# Patient Record
Sex: Male | Born: 1945 | Race: White | Hispanic: No | Smoking: Former smoker
Health system: Southern US, Community
[De-identification: ages and names within clinical notes are randomized; demographics above are authoritative.]

## PROBLEM LIST (undated history)

## (undated) DIAGNOSIS — K219 Gastro-esophageal reflux disease without esophagitis: Secondary | ICD-10-CM

## (undated) DIAGNOSIS — Z6834 Body mass index (BMI) 34.0-34.9, adult: Secondary | ICD-10-CM

## (undated) DIAGNOSIS — M1990 Unspecified osteoarthritis, unspecified site: Secondary | ICD-10-CM

## (undated) DIAGNOSIS — R7989 Other specified abnormal findings of blood chemistry: Secondary | ICD-10-CM

## (undated) DIAGNOSIS — N529 Male erectile dysfunction, unspecified: Secondary | ICD-10-CM

## (undated) DIAGNOSIS — N183 Chronic kidney disease, stage 3 (moderate): Secondary | ICD-10-CM

## (undated) DIAGNOSIS — G629 Polyneuropathy, unspecified: Secondary | ICD-10-CM

## (undated) DIAGNOSIS — I441 Atrioventricular block, second degree: Secondary | ICD-10-CM

## (undated) DIAGNOSIS — M549 Dorsalgia, unspecified: Secondary | ICD-10-CM

## (undated) DIAGNOSIS — E7800 Pure hypercholesterolemia, unspecified: Secondary | ICD-10-CM

## (undated) DIAGNOSIS — K5900 Constipation, unspecified: Secondary | ICD-10-CM

## (undated) DIAGNOSIS — Z87891 Personal history of nicotine dependence: Secondary | ICD-10-CM

## (undated) DIAGNOSIS — E119 Type 2 diabetes mellitus without complications: Secondary | ICD-10-CM

## (undated) DIAGNOSIS — E785 Hyperlipidemia, unspecified: Secondary | ICD-10-CM

## (undated) DIAGNOSIS — I10 Essential (primary) hypertension: Secondary | ICD-10-CM

## (undated) DIAGNOSIS — N289 Disorder of kidney and ureter, unspecified: Secondary | ICD-10-CM

## (undated) DIAGNOSIS — R945 Abnormal results of liver function studies: Secondary | ICD-10-CM

## (undated) DIAGNOSIS — E1122 Type 2 diabetes mellitus with diabetic chronic kidney disease: Secondary | ICD-10-CM

## (undated) HISTORY — DX: Hyperlipidemia, unspecified: E78.5

## (undated) HISTORY — DX: Abnormal results of liver function studies: R94.5

## (undated) HISTORY — DX: Polyneuropathy, unspecified: G62.9

## (undated) HISTORY — DX: Dorsalgia, unspecified: M54.9

## (undated) HISTORY — DX: Chronic kidney disease, stage 3 (moderate): N18.3

## (undated) HISTORY — DX: Pure hypercholesterolemia, unspecified: E78.00

## (undated) HISTORY — DX: Type 2 diabetes mellitus without complications: E11.9

## (undated) HISTORY — DX: Male erectile dysfunction, unspecified: N52.9

## (undated) HISTORY — DX: Unspecified osteoarthritis, unspecified site: M19.90

## (undated) HISTORY — DX: Body mass index (bmi) 34.0-34.9, adult: Z68.34

## (undated) HISTORY — DX: Type 2 diabetes mellitus with diabetic chronic kidney disease: E11.22

## (undated) HISTORY — DX: Personal history of nicotine dependence: Z87.891

## (undated) HISTORY — PX: CATARACT EXTRACTION, BILATERAL: SHX1313

## (undated) HISTORY — DX: Atrioventricular block, second degree: I44.1

## (undated) HISTORY — DX: Disorder of kidney and ureter, unspecified: N28.9

## (undated) HISTORY — DX: Essential (primary) hypertension: I10

## (undated) HISTORY — DX: Other specified abnormal findings of blood chemistry: R79.89

## (undated) HISTORY — DX: Gastro-esophageal reflux disease without esophagitis: K21.9

## (undated) HISTORY — DX: Constipation, unspecified: K59.00

---

## 2000-01-16 ENCOUNTER — Encounter: Payer: Self-pay | Admitting: Neurosurgery

## 2000-01-20 ENCOUNTER — Encounter: Payer: Self-pay | Admitting: Neurosurgery

## 2000-01-20 ENCOUNTER — Inpatient Hospital Stay (HOSPITAL_COMMUNITY): Admission: RE | Admit: 2000-01-20 | Discharge: 2000-01-24 | Payer: Self-pay | Admitting: Neurosurgery

## 2000-02-05 ENCOUNTER — Encounter: Payer: Self-pay | Admitting: Neurosurgery

## 2000-02-05 ENCOUNTER — Encounter: Admission: RE | Admit: 2000-02-05 | Discharge: 2000-02-05 | Payer: Self-pay | Admitting: Neurosurgery

## 2000-03-29 ENCOUNTER — Encounter: Payer: Self-pay | Admitting: Neurosurgery

## 2000-03-29 ENCOUNTER — Encounter: Admission: RE | Admit: 2000-03-29 | Discharge: 2000-03-29 | Payer: Self-pay | Admitting: Neurosurgery

## 2000-06-02 ENCOUNTER — Encounter: Payer: Self-pay | Admitting: Neurosurgery

## 2000-06-02 ENCOUNTER — Encounter: Admission: RE | Admit: 2000-06-02 | Discharge: 2000-06-02 | Payer: Self-pay | Admitting: Neurosurgery

## 2000-12-17 ENCOUNTER — Encounter: Payer: Self-pay | Admitting: Neurosurgery

## 2000-12-17 ENCOUNTER — Ambulatory Visit (HOSPITAL_COMMUNITY): Admission: RE | Admit: 2000-12-17 | Discharge: 2000-12-17 | Payer: Self-pay | Admitting: Neurosurgery

## 2001-02-04 ENCOUNTER — Encounter: Payer: Self-pay | Admitting: Neurosurgery

## 2001-02-08 ENCOUNTER — Encounter: Payer: Self-pay | Admitting: Neurosurgery

## 2001-02-08 ENCOUNTER — Inpatient Hospital Stay (HOSPITAL_COMMUNITY): Admission: RE | Admit: 2001-02-08 | Discharge: 2001-02-09 | Payer: Self-pay | Admitting: Neurosurgery

## 2001-03-24 ENCOUNTER — Encounter: Admission: RE | Admit: 2001-03-24 | Discharge: 2001-03-24 | Payer: Self-pay | Admitting: Neurosurgery

## 2001-03-24 ENCOUNTER — Encounter: Payer: Self-pay | Admitting: Neurosurgery

## 2015-08-23 DIAGNOSIS — E119 Type 2 diabetes mellitus without complications: Secondary | ICD-10-CM | POA: Diagnosis not present

## 2015-09-17 DIAGNOSIS — L03115 Cellulitis of right lower limb: Secondary | ICD-10-CM | POA: Diagnosis not present

## 2015-09-17 DIAGNOSIS — M7050 Other bursitis of knee, unspecified knee: Secondary | ICD-10-CM | POA: Diagnosis not present

## 2015-09-17 DIAGNOSIS — E7800 Pure hypercholesterolemia, unspecified: Secondary | ICD-10-CM | POA: Diagnosis not present

## 2015-09-17 DIAGNOSIS — M7989 Other specified soft tissue disorders: Secondary | ICD-10-CM | POA: Diagnosis not present

## 2015-09-17 DIAGNOSIS — M25561 Pain in right knee: Secondary | ICD-10-CM | POA: Diagnosis not present

## 2015-09-17 DIAGNOSIS — Z8614 Personal history of Methicillin resistant Staphylococcus aureus infection: Secondary | ICD-10-CM | POA: Diagnosis not present

## 2015-09-17 DIAGNOSIS — M25461 Effusion, right knee: Secondary | ICD-10-CM | POA: Diagnosis not present

## 2015-09-17 DIAGNOSIS — N400 Enlarged prostate without lower urinary tract symptoms: Secondary | ICD-10-CM | POA: Diagnosis not present

## 2015-09-17 DIAGNOSIS — Z7982 Long term (current) use of aspirin: Secondary | ICD-10-CM | POA: Diagnosis not present

## 2015-09-17 DIAGNOSIS — I10 Essential (primary) hypertension: Secondary | ICD-10-CM | POA: Diagnosis not present

## 2015-09-17 DIAGNOSIS — M71161 Other infective bursitis, right knee: Secondary | ICD-10-CM | POA: Diagnosis not present

## 2015-09-18 DIAGNOSIS — E7800 Pure hypercholesterolemia, unspecified: Secondary | ICD-10-CM | POA: Diagnosis not present

## 2015-09-18 DIAGNOSIS — Z8614 Personal history of Methicillin resistant Staphylococcus aureus infection: Secondary | ICD-10-CM | POA: Diagnosis not present

## 2015-09-18 DIAGNOSIS — I10 Essential (primary) hypertension: Secondary | ICD-10-CM | POA: Diagnosis not present

## 2015-09-18 DIAGNOSIS — N400 Enlarged prostate without lower urinary tract symptoms: Secondary | ICD-10-CM | POA: Diagnosis not present

## 2015-09-18 DIAGNOSIS — M71161 Other infective bursitis, right knee: Secondary | ICD-10-CM | POA: Diagnosis not present

## 2015-09-18 DIAGNOSIS — Z7982 Long term (current) use of aspirin: Secondary | ICD-10-CM | POA: Diagnosis not present

## 2015-09-18 DIAGNOSIS — L03115 Cellulitis of right lower limb: Secondary | ICD-10-CM | POA: Diagnosis not present

## 2015-09-19 DIAGNOSIS — M25561 Pain in right knee: Secondary | ICD-10-CM | POA: Diagnosis not present

## 2015-09-19 DIAGNOSIS — Z8614 Personal history of Methicillin resistant Staphylococcus aureus infection: Secondary | ICD-10-CM | POA: Diagnosis not present

## 2015-09-19 DIAGNOSIS — M71161 Other infective bursitis, right knee: Secondary | ICD-10-CM | POA: Diagnosis not present

## 2015-09-19 DIAGNOSIS — E7800 Pure hypercholesterolemia, unspecified: Secondary | ICD-10-CM | POA: Diagnosis not present

## 2015-09-19 DIAGNOSIS — N400 Enlarged prostate without lower urinary tract symptoms: Secondary | ICD-10-CM | POA: Diagnosis not present

## 2015-09-19 DIAGNOSIS — Z7982 Long term (current) use of aspirin: Secondary | ICD-10-CM | POA: Diagnosis not present

## 2015-09-19 DIAGNOSIS — I10 Essential (primary) hypertension: Secondary | ICD-10-CM | POA: Diagnosis not present

## 2015-09-19 DIAGNOSIS — L03115 Cellulitis of right lower limb: Secondary | ICD-10-CM | POA: Diagnosis not present

## 2015-09-20 DIAGNOSIS — L03115 Cellulitis of right lower limb: Secondary | ICD-10-CM | POA: Diagnosis not present

## 2015-09-20 DIAGNOSIS — Z8614 Personal history of Methicillin resistant Staphylococcus aureus infection: Secondary | ICD-10-CM | POA: Diagnosis not present

## 2015-09-20 DIAGNOSIS — I10 Essential (primary) hypertension: Secondary | ICD-10-CM | POA: Diagnosis not present

## 2015-09-20 DIAGNOSIS — Z7982 Long term (current) use of aspirin: Secondary | ICD-10-CM | POA: Diagnosis not present

## 2015-09-20 DIAGNOSIS — N400 Enlarged prostate without lower urinary tract symptoms: Secondary | ICD-10-CM | POA: Diagnosis not present

## 2015-09-20 DIAGNOSIS — M71161 Other infective bursitis, right knee: Secondary | ICD-10-CM | POA: Diagnosis not present

## 2015-09-20 DIAGNOSIS — E7800 Pure hypercholesterolemia, unspecified: Secondary | ICD-10-CM | POA: Diagnosis not present

## 2015-09-21 DIAGNOSIS — M7051 Other bursitis of knee, right knee: Secondary | ICD-10-CM | POA: Diagnosis not present

## 2015-09-21 DIAGNOSIS — K219 Gastro-esophageal reflux disease without esophagitis: Secondary | ICD-10-CM | POA: Diagnosis not present

## 2015-09-21 DIAGNOSIS — N529 Male erectile dysfunction, unspecified: Secondary | ICD-10-CM | POA: Diagnosis not present

## 2015-09-21 DIAGNOSIS — E669 Obesity, unspecified: Secondary | ICD-10-CM | POA: Diagnosis not present

## 2015-09-21 DIAGNOSIS — E7800 Pure hypercholesterolemia, unspecified: Secondary | ICD-10-CM | POA: Diagnosis not present

## 2015-09-21 DIAGNOSIS — I10 Essential (primary) hypertension: Secondary | ICD-10-CM | POA: Diagnosis not present

## 2015-09-21 DIAGNOSIS — M159 Polyosteoarthritis, unspecified: Secondary | ICD-10-CM | POA: Diagnosis not present

## 2015-09-21 DIAGNOSIS — E119 Type 2 diabetes mellitus without complications: Secondary | ICD-10-CM | POA: Diagnosis not present

## 2015-09-21 DIAGNOSIS — M549 Dorsalgia, unspecified: Secondary | ICD-10-CM | POA: Diagnosis not present

## 2015-09-21 DIAGNOSIS — R945 Abnormal results of liver function studies: Secondary | ICD-10-CM | POA: Diagnosis not present

## 2015-10-04 DIAGNOSIS — M7041 Prepatellar bursitis, right knee: Secondary | ICD-10-CM | POA: Diagnosis not present

## 2015-10-19 DIAGNOSIS — K219 Gastro-esophageal reflux disease without esophagitis: Secondary | ICD-10-CM | POA: Diagnosis not present

## 2015-10-19 DIAGNOSIS — E119 Type 2 diabetes mellitus without complications: Secondary | ICD-10-CM | POA: Diagnosis not present

## 2015-10-19 DIAGNOSIS — R945 Abnormal results of liver function studies: Secondary | ICD-10-CM | POA: Diagnosis not present

## 2015-10-19 DIAGNOSIS — N529 Male erectile dysfunction, unspecified: Secondary | ICD-10-CM | POA: Diagnosis not present

## 2015-10-19 DIAGNOSIS — E1122 Type 2 diabetes mellitus with diabetic chronic kidney disease: Secondary | ICD-10-CM | POA: Diagnosis not present

## 2015-10-19 DIAGNOSIS — Z23 Encounter for immunization: Secondary | ICD-10-CM | POA: Diagnosis not present

## 2015-10-19 DIAGNOSIS — I10 Essential (primary) hypertension: Secondary | ICD-10-CM | POA: Diagnosis not present

## 2015-10-19 DIAGNOSIS — Z1389 Encounter for screening for other disorder: Secondary | ICD-10-CM | POA: Diagnosis not present

## 2015-10-19 DIAGNOSIS — K5909 Other constipation: Secondary | ICD-10-CM | POA: Diagnosis not present

## 2015-10-19 DIAGNOSIS — M159 Polyosteoarthritis, unspecified: Secondary | ICD-10-CM | POA: Diagnosis not present

## 2015-11-30 DIAGNOSIS — N529 Male erectile dysfunction, unspecified: Secondary | ICD-10-CM | POA: Diagnosis not present

## 2015-11-30 DIAGNOSIS — E1122 Type 2 diabetes mellitus with diabetic chronic kidney disease: Secondary | ICD-10-CM | POA: Diagnosis not present

## 2015-11-30 DIAGNOSIS — E7800 Pure hypercholesterolemia, unspecified: Secondary | ICD-10-CM | POA: Diagnosis not present

## 2015-11-30 DIAGNOSIS — I10 Essential (primary) hypertension: Secondary | ICD-10-CM | POA: Diagnosis not present

## 2015-11-30 DIAGNOSIS — K5909 Other constipation: Secondary | ICD-10-CM | POA: Diagnosis not present

## 2015-11-30 DIAGNOSIS — K219 Gastro-esophageal reflux disease without esophagitis: Secondary | ICD-10-CM | POA: Diagnosis not present

## 2015-11-30 DIAGNOSIS — M549 Dorsalgia, unspecified: Secondary | ICD-10-CM | POA: Diagnosis not present

## 2015-11-30 DIAGNOSIS — E119 Type 2 diabetes mellitus without complications: Secondary | ICD-10-CM | POA: Diagnosis not present

## 2015-11-30 DIAGNOSIS — M159 Polyosteoarthritis, unspecified: Secondary | ICD-10-CM | POA: Diagnosis not present

## 2015-11-30 DIAGNOSIS — R945 Abnormal results of liver function studies: Secondary | ICD-10-CM | POA: Diagnosis not present

## 2015-12-13 DIAGNOSIS — E7800 Pure hypercholesterolemia, unspecified: Secondary | ICD-10-CM | POA: Diagnosis not present

## 2016-01-04 DIAGNOSIS — I10 Essential (primary) hypertension: Secondary | ICD-10-CM | POA: Diagnosis not present

## 2016-01-04 DIAGNOSIS — R945 Abnormal results of liver function studies: Secondary | ICD-10-CM | POA: Diagnosis not present

## 2016-01-04 DIAGNOSIS — M159 Polyosteoarthritis, unspecified: Secondary | ICD-10-CM | POA: Diagnosis not present

## 2016-01-04 DIAGNOSIS — N529 Male erectile dysfunction, unspecified: Secondary | ICD-10-CM | POA: Diagnosis not present

## 2016-01-04 DIAGNOSIS — E1122 Type 2 diabetes mellitus with diabetic chronic kidney disease: Secondary | ICD-10-CM | POA: Diagnosis not present

## 2016-01-04 DIAGNOSIS — E669 Obesity, unspecified: Secondary | ICD-10-CM | POA: Diagnosis not present

## 2016-01-04 DIAGNOSIS — K219 Gastro-esophageal reflux disease without esophagitis: Secondary | ICD-10-CM | POA: Diagnosis not present

## 2016-01-04 DIAGNOSIS — M549 Dorsalgia, unspecified: Secondary | ICD-10-CM | POA: Diagnosis not present

## 2016-01-04 DIAGNOSIS — E7800 Pure hypercholesterolemia, unspecified: Secondary | ICD-10-CM | POA: Diagnosis not present

## 2016-01-04 DIAGNOSIS — E119 Type 2 diabetes mellitus without complications: Secondary | ICD-10-CM | POA: Diagnosis not present

## 2016-04-04 DIAGNOSIS — Z9181 History of falling: Secondary | ICD-10-CM | POA: Diagnosis not present

## 2016-04-04 DIAGNOSIS — M549 Dorsalgia, unspecified: Secondary | ICD-10-CM | POA: Diagnosis not present

## 2016-04-04 DIAGNOSIS — E119 Type 2 diabetes mellitus without complications: Secondary | ICD-10-CM | POA: Diagnosis not present

## 2016-04-04 DIAGNOSIS — K219 Gastro-esophageal reflux disease without esophagitis: Secondary | ICD-10-CM | POA: Diagnosis not present

## 2016-04-04 DIAGNOSIS — N529 Male erectile dysfunction, unspecified: Secondary | ICD-10-CM | POA: Diagnosis not present

## 2016-04-04 DIAGNOSIS — R945 Abnormal results of liver function studies: Secondary | ICD-10-CM | POA: Diagnosis not present

## 2016-04-04 DIAGNOSIS — E1122 Type 2 diabetes mellitus with diabetic chronic kidney disease: Secondary | ICD-10-CM | POA: Diagnosis not present

## 2016-04-04 DIAGNOSIS — E7800 Pure hypercholesterolemia, unspecified: Secondary | ICD-10-CM | POA: Diagnosis not present

## 2016-04-04 DIAGNOSIS — I10 Essential (primary) hypertension: Secondary | ICD-10-CM | POA: Diagnosis not present

## 2016-04-04 DIAGNOSIS — M159 Polyosteoarthritis, unspecified: Secondary | ICD-10-CM | POA: Diagnosis not present

## 2016-04-13 DIAGNOSIS — M545 Low back pain: Secondary | ICD-10-CM | POA: Diagnosis not present

## 2016-04-13 DIAGNOSIS — S299XXA Unspecified injury of thorax, initial encounter: Secondary | ICD-10-CM | POA: Diagnosis not present

## 2016-04-15 DIAGNOSIS — M545 Low back pain: Secondary | ICD-10-CM | POA: Diagnosis not present

## 2016-07-04 DIAGNOSIS — E119 Type 2 diabetes mellitus without complications: Secondary | ICD-10-CM | POA: Diagnosis not present

## 2016-07-04 DIAGNOSIS — E669 Obesity, unspecified: Secondary | ICD-10-CM | POA: Diagnosis not present

## 2016-07-04 DIAGNOSIS — N529 Male erectile dysfunction, unspecified: Secondary | ICD-10-CM | POA: Diagnosis not present

## 2016-07-04 DIAGNOSIS — M549 Dorsalgia, unspecified: Secondary | ICD-10-CM | POA: Diagnosis not present

## 2016-07-04 DIAGNOSIS — E7800 Pure hypercholesterolemia, unspecified: Secondary | ICD-10-CM | POA: Diagnosis not present

## 2016-07-04 DIAGNOSIS — Z6832 Body mass index (BMI) 32.0-32.9, adult: Secondary | ICD-10-CM | POA: Diagnosis not present

## 2016-07-04 DIAGNOSIS — E1122 Type 2 diabetes mellitus with diabetic chronic kidney disease: Secondary | ICD-10-CM | POA: Diagnosis not present

## 2016-07-04 DIAGNOSIS — R945 Abnormal results of liver function studies: Secondary | ICD-10-CM | POA: Diagnosis not present

## 2016-07-04 DIAGNOSIS — I10 Essential (primary) hypertension: Secondary | ICD-10-CM | POA: Diagnosis not present

## 2016-07-04 DIAGNOSIS — M159 Polyosteoarthritis, unspecified: Secondary | ICD-10-CM | POA: Diagnosis not present

## 2016-10-03 DIAGNOSIS — K219 Gastro-esophageal reflux disease without esophagitis: Secondary | ICD-10-CM | POA: Diagnosis not present

## 2016-10-03 DIAGNOSIS — I10 Essential (primary) hypertension: Secondary | ICD-10-CM | POA: Diagnosis not present

## 2016-10-03 DIAGNOSIS — K5909 Other constipation: Secondary | ICD-10-CM | POA: Diagnosis not present

## 2016-10-03 DIAGNOSIS — M549 Dorsalgia, unspecified: Secondary | ICD-10-CM | POA: Diagnosis not present

## 2016-10-03 DIAGNOSIS — E119 Type 2 diabetes mellitus without complications: Secondary | ICD-10-CM | POA: Diagnosis not present

## 2016-10-03 DIAGNOSIS — E1122 Type 2 diabetes mellitus with diabetic chronic kidney disease: Secondary | ICD-10-CM | POA: Diagnosis not present

## 2016-10-03 DIAGNOSIS — M159 Polyosteoarthritis, unspecified: Secondary | ICD-10-CM | POA: Diagnosis not present

## 2016-10-03 DIAGNOSIS — E7800 Pure hypercholesterolemia, unspecified: Secondary | ICD-10-CM | POA: Diagnosis not present

## 2016-10-03 DIAGNOSIS — R945 Abnormal results of liver function studies: Secondary | ICD-10-CM | POA: Diagnosis not present

## 2016-10-03 DIAGNOSIS — Z23 Encounter for immunization: Secondary | ICD-10-CM | POA: Diagnosis not present

## 2016-10-31 DIAGNOSIS — M549 Dorsalgia, unspecified: Secondary | ICD-10-CM | POA: Diagnosis not present

## 2016-10-31 DIAGNOSIS — K219 Gastro-esophageal reflux disease without esophagitis: Secondary | ICD-10-CM | POA: Diagnosis not present

## 2016-10-31 DIAGNOSIS — K5909 Other constipation: Secondary | ICD-10-CM | POA: Diagnosis not present

## 2016-10-31 DIAGNOSIS — Z1389 Encounter for screening for other disorder: Secondary | ICD-10-CM | POA: Diagnosis not present

## 2016-10-31 DIAGNOSIS — E1122 Type 2 diabetes mellitus with diabetic chronic kidney disease: Secondary | ICD-10-CM | POA: Diagnosis not present

## 2016-10-31 DIAGNOSIS — R945 Abnormal results of liver function studies: Secondary | ICD-10-CM | POA: Diagnosis not present

## 2016-10-31 DIAGNOSIS — I10 Essential (primary) hypertension: Secondary | ICD-10-CM | POA: Diagnosis not present

## 2016-10-31 DIAGNOSIS — E7800 Pure hypercholesterolemia, unspecified: Secondary | ICD-10-CM | POA: Diagnosis not present

## 2016-10-31 DIAGNOSIS — N529 Male erectile dysfunction, unspecified: Secondary | ICD-10-CM | POA: Diagnosis not present

## 2016-10-31 DIAGNOSIS — E119 Type 2 diabetes mellitus without complications: Secondary | ICD-10-CM | POA: Diagnosis not present

## 2016-12-12 DIAGNOSIS — E119 Type 2 diabetes mellitus without complications: Secondary | ICD-10-CM | POA: Diagnosis not present

## 2016-12-12 DIAGNOSIS — R945 Abnormal results of liver function studies: Secondary | ICD-10-CM | POA: Diagnosis not present

## 2016-12-12 DIAGNOSIS — E7800 Pure hypercholesterolemia, unspecified: Secondary | ICD-10-CM | POA: Diagnosis not present

## 2016-12-12 DIAGNOSIS — K219 Gastro-esophageal reflux disease without esophagitis: Secondary | ICD-10-CM | POA: Diagnosis not present

## 2016-12-12 DIAGNOSIS — I10 Essential (primary) hypertension: Secondary | ICD-10-CM | POA: Diagnosis not present

## 2016-12-12 DIAGNOSIS — M549 Dorsalgia, unspecified: Secondary | ICD-10-CM | POA: Diagnosis not present

## 2016-12-12 DIAGNOSIS — K5909 Other constipation: Secondary | ICD-10-CM | POA: Diagnosis not present

## 2016-12-12 DIAGNOSIS — N529 Male erectile dysfunction, unspecified: Secondary | ICD-10-CM | POA: Diagnosis not present

## 2016-12-12 DIAGNOSIS — E1122 Type 2 diabetes mellitus with diabetic chronic kidney disease: Secondary | ICD-10-CM | POA: Diagnosis not present

## 2016-12-12 DIAGNOSIS — M159 Polyosteoarthritis, unspecified: Secondary | ICD-10-CM | POA: Diagnosis not present

## 2017-01-09 DIAGNOSIS — I10 Essential (primary) hypertension: Secondary | ICD-10-CM | POA: Diagnosis not present

## 2017-01-09 DIAGNOSIS — M549 Dorsalgia, unspecified: Secondary | ICD-10-CM | POA: Diagnosis not present

## 2017-01-09 DIAGNOSIS — E7800 Pure hypercholesterolemia, unspecified: Secondary | ICD-10-CM | POA: Diagnosis not present

## 2017-01-09 DIAGNOSIS — E119 Type 2 diabetes mellitus without complications: Secondary | ICD-10-CM | POA: Diagnosis not present

## 2017-01-09 DIAGNOSIS — K5909 Other constipation: Secondary | ICD-10-CM | POA: Diagnosis not present

## 2017-01-09 DIAGNOSIS — G609 Hereditary and idiopathic neuropathy, unspecified: Secondary | ICD-10-CM | POA: Diagnosis not present

## 2017-01-09 DIAGNOSIS — M159 Polyosteoarthritis, unspecified: Secondary | ICD-10-CM | POA: Diagnosis not present

## 2017-01-09 DIAGNOSIS — K219 Gastro-esophageal reflux disease without esophagitis: Secondary | ICD-10-CM | POA: Diagnosis not present

## 2017-01-09 DIAGNOSIS — N529 Male erectile dysfunction, unspecified: Secondary | ICD-10-CM | POA: Diagnosis not present

## 2017-01-09 DIAGNOSIS — R945 Abnormal results of liver function studies: Secondary | ICD-10-CM | POA: Diagnosis not present

## 2017-01-11 DIAGNOSIS — I10 Essential (primary) hypertension: Secondary | ICD-10-CM | POA: Diagnosis not present

## 2017-01-11 DIAGNOSIS — N529 Male erectile dysfunction, unspecified: Secondary | ICD-10-CM | POA: Diagnosis not present

## 2017-01-11 DIAGNOSIS — E119 Type 2 diabetes mellitus without complications: Secondary | ICD-10-CM | POA: Diagnosis not present

## 2017-01-11 DIAGNOSIS — E1122 Type 2 diabetes mellitus with diabetic chronic kidney disease: Secondary | ICD-10-CM | POA: Diagnosis not present

## 2017-01-11 DIAGNOSIS — K219 Gastro-esophageal reflux disease without esophagitis: Secondary | ICD-10-CM | POA: Diagnosis not present

## 2017-01-11 DIAGNOSIS — M159 Polyosteoarthritis, unspecified: Secondary | ICD-10-CM | POA: Diagnosis not present

## 2017-01-11 DIAGNOSIS — M549 Dorsalgia, unspecified: Secondary | ICD-10-CM | POA: Diagnosis not present

## 2017-01-11 DIAGNOSIS — E7800 Pure hypercholesterolemia, unspecified: Secondary | ICD-10-CM | POA: Diagnosis not present

## 2017-01-11 DIAGNOSIS — K5909 Other constipation: Secondary | ICD-10-CM | POA: Diagnosis not present

## 2017-01-11 DIAGNOSIS — R945 Abnormal results of liver function studies: Secondary | ICD-10-CM | POA: Diagnosis not present

## 2017-01-16 DIAGNOSIS — Z87891 Personal history of nicotine dependence: Secondary | ICD-10-CM | POA: Diagnosis not present

## 2017-01-16 DIAGNOSIS — Z6833 Body mass index (BMI) 33.0-33.9, adult: Secondary | ICD-10-CM | POA: Diagnosis not present

## 2017-01-16 DIAGNOSIS — K219 Gastro-esophageal reflux disease without esophagitis: Secondary | ICD-10-CM | POA: Diagnosis not present

## 2017-01-16 DIAGNOSIS — E119 Type 2 diabetes mellitus without complications: Secondary | ICD-10-CM | POA: Diagnosis not present

## 2017-01-16 DIAGNOSIS — I10 Essential (primary) hypertension: Secondary | ICD-10-CM | POA: Diagnosis not present

## 2017-01-16 DIAGNOSIS — N529 Male erectile dysfunction, unspecified: Secondary | ICD-10-CM | POA: Diagnosis not present

## 2017-01-16 DIAGNOSIS — R945 Abnormal results of liver function studies: Secondary | ICD-10-CM | POA: Diagnosis not present

## 2017-01-16 DIAGNOSIS — M159 Polyosteoarthritis, unspecified: Secondary | ICD-10-CM | POA: Diagnosis not present

## 2017-01-16 DIAGNOSIS — E669 Obesity, unspecified: Secondary | ICD-10-CM | POA: Diagnosis not present

## 2017-01-16 DIAGNOSIS — E1122 Type 2 diabetes mellitus with diabetic chronic kidney disease: Secondary | ICD-10-CM | POA: Diagnosis not present

## 2017-01-20 DIAGNOSIS — E119 Type 2 diabetes mellitus without complications: Secondary | ICD-10-CM | POA: Diagnosis not present

## 2017-01-20 DIAGNOSIS — M159 Polyosteoarthritis, unspecified: Secondary | ICD-10-CM | POA: Diagnosis not present

## 2017-01-20 DIAGNOSIS — E1122 Type 2 diabetes mellitus with diabetic chronic kidney disease: Secondary | ICD-10-CM | POA: Diagnosis not present

## 2017-01-20 DIAGNOSIS — K5909 Other constipation: Secondary | ICD-10-CM | POA: Diagnosis not present

## 2017-01-20 DIAGNOSIS — N390 Urinary tract infection, site not specified: Secondary | ICD-10-CM | POA: Diagnosis not present

## 2017-01-20 DIAGNOSIS — K219 Gastro-esophageal reflux disease without esophagitis: Secondary | ICD-10-CM | POA: Diagnosis not present

## 2017-01-20 DIAGNOSIS — I10 Essential (primary) hypertension: Secondary | ICD-10-CM | POA: Diagnosis not present

## 2017-01-20 DIAGNOSIS — R945 Abnormal results of liver function studies: Secondary | ICD-10-CM | POA: Diagnosis not present

## 2017-01-20 DIAGNOSIS — N529 Male erectile dysfunction, unspecified: Secondary | ICD-10-CM | POA: Diagnosis not present

## 2017-01-20 DIAGNOSIS — E7800 Pure hypercholesterolemia, unspecified: Secondary | ICD-10-CM | POA: Diagnosis not present

## 2017-01-27 DIAGNOSIS — E7800 Pure hypercholesterolemia, unspecified: Secondary | ICD-10-CM | POA: Diagnosis not present

## 2017-01-27 DIAGNOSIS — R945 Abnormal results of liver function studies: Secondary | ICD-10-CM | POA: Diagnosis not present

## 2017-01-27 DIAGNOSIS — K5909 Other constipation: Secondary | ICD-10-CM | POA: Diagnosis not present

## 2017-01-27 DIAGNOSIS — E1122 Type 2 diabetes mellitus with diabetic chronic kidney disease: Secondary | ICD-10-CM | POA: Diagnosis not present

## 2017-01-27 DIAGNOSIS — E119 Type 2 diabetes mellitus without complications: Secondary | ICD-10-CM | POA: Diagnosis not present

## 2017-01-27 DIAGNOSIS — I10 Essential (primary) hypertension: Secondary | ICD-10-CM | POA: Diagnosis not present

## 2017-01-27 DIAGNOSIS — K219 Gastro-esophageal reflux disease without esophagitis: Secondary | ICD-10-CM | POA: Diagnosis not present

## 2017-01-27 DIAGNOSIS — N390 Urinary tract infection, site not specified: Secondary | ICD-10-CM | POA: Diagnosis not present

## 2017-01-27 DIAGNOSIS — M159 Polyosteoarthritis, unspecified: Secondary | ICD-10-CM | POA: Diagnosis not present

## 2017-01-27 DIAGNOSIS — N529 Male erectile dysfunction, unspecified: Secondary | ICD-10-CM | POA: Diagnosis not present

## 2017-02-03 DIAGNOSIS — E7800 Pure hypercholesterolemia, unspecified: Secondary | ICD-10-CM | POA: Diagnosis not present

## 2017-02-03 DIAGNOSIS — I10 Essential (primary) hypertension: Secondary | ICD-10-CM | POA: Diagnosis not present

## 2017-02-03 DIAGNOSIS — M159 Polyosteoarthritis, unspecified: Secondary | ICD-10-CM | POA: Diagnosis not present

## 2017-02-03 DIAGNOSIS — K219 Gastro-esophageal reflux disease without esophagitis: Secondary | ICD-10-CM | POA: Diagnosis not present

## 2017-02-03 DIAGNOSIS — E1122 Type 2 diabetes mellitus with diabetic chronic kidney disease: Secondary | ICD-10-CM | POA: Diagnosis not present

## 2017-02-03 DIAGNOSIS — N390 Urinary tract infection, site not specified: Secondary | ICD-10-CM | POA: Diagnosis not present

## 2017-02-03 DIAGNOSIS — R945 Abnormal results of liver function studies: Secondary | ICD-10-CM | POA: Diagnosis not present

## 2017-02-03 DIAGNOSIS — E119 Type 2 diabetes mellitus without complications: Secondary | ICD-10-CM | POA: Diagnosis not present

## 2017-02-03 DIAGNOSIS — K5909 Other constipation: Secondary | ICD-10-CM | POA: Diagnosis not present

## 2017-02-03 DIAGNOSIS — N529 Male erectile dysfunction, unspecified: Secondary | ICD-10-CM | POA: Diagnosis not present

## 2017-02-04 DIAGNOSIS — Z0000 Encounter for general adult medical examination without abnormal findings: Secondary | ICD-10-CM | POA: Diagnosis not present

## 2017-02-04 DIAGNOSIS — H9110 Presbycusis, unspecified ear: Secondary | ICD-10-CM | POA: Diagnosis not present

## 2017-02-04 DIAGNOSIS — E7800 Pure hypercholesterolemia, unspecified: Secondary | ICD-10-CM | POA: Diagnosis not present

## 2017-02-04 DIAGNOSIS — E119 Type 2 diabetes mellitus without complications: Secondary | ICD-10-CM | POA: Diagnosis not present

## 2017-02-04 DIAGNOSIS — G4700 Insomnia, unspecified: Secondary | ICD-10-CM | POA: Diagnosis not present

## 2017-02-04 DIAGNOSIS — Z972 Presence of dental prosthetic device (complete) (partial): Secondary | ICD-10-CM | POA: Diagnosis not present

## 2017-02-04 DIAGNOSIS — M79671 Pain in right foot: Secondary | ICD-10-CM | POA: Diagnosis not present

## 2017-02-04 DIAGNOSIS — Z87891 Personal history of nicotine dependence: Secondary | ICD-10-CM | POA: Diagnosis not present

## 2017-02-04 DIAGNOSIS — Z79899 Other long term (current) drug therapy: Secondary | ICD-10-CM | POA: Diagnosis not present

## 2017-02-04 DIAGNOSIS — E669 Obesity, unspecified: Secondary | ICD-10-CM | POA: Diagnosis not present

## 2017-02-04 DIAGNOSIS — R000 Tachycardia, unspecified: Secondary | ICD-10-CM | POA: Diagnosis not present

## 2017-02-04 DIAGNOSIS — Z7982 Long term (current) use of aspirin: Secondary | ICD-10-CM | POA: Diagnosis not present

## 2017-02-04 DIAGNOSIS — K08109 Complete loss of teeth, unspecified cause, unspecified class: Secondary | ICD-10-CM | POA: Diagnosis not present

## 2017-02-04 DIAGNOSIS — I10 Essential (primary) hypertension: Secondary | ICD-10-CM | POA: Diagnosis not present

## 2017-02-04 DIAGNOSIS — Z973 Presence of spectacles and contact lenses: Secondary | ICD-10-CM | POA: Diagnosis not present

## 2017-02-04 DIAGNOSIS — Z961 Presence of intraocular lens: Secondary | ICD-10-CM | POA: Diagnosis not present

## 2017-02-04 DIAGNOSIS — N400 Enlarged prostate without lower urinary tract symptoms: Secondary | ICD-10-CM | POA: Diagnosis not present

## 2017-02-04 DIAGNOSIS — H3530 Unspecified macular degeneration: Secondary | ICD-10-CM | POA: Diagnosis not present

## 2017-02-10 DIAGNOSIS — E7800 Pure hypercholesterolemia, unspecified: Secondary | ICD-10-CM | POA: Diagnosis not present

## 2017-02-10 DIAGNOSIS — K5909 Other constipation: Secondary | ICD-10-CM | POA: Diagnosis not present

## 2017-02-10 DIAGNOSIS — R945 Abnormal results of liver function studies: Secondary | ICD-10-CM | POA: Diagnosis not present

## 2017-02-10 DIAGNOSIS — I10 Essential (primary) hypertension: Secondary | ICD-10-CM | POA: Diagnosis not present

## 2017-02-10 DIAGNOSIS — E119 Type 2 diabetes mellitus without complications: Secondary | ICD-10-CM | POA: Diagnosis not present

## 2017-02-10 DIAGNOSIS — G609 Hereditary and idiopathic neuropathy, unspecified: Secondary | ICD-10-CM | POA: Diagnosis not present

## 2017-02-10 DIAGNOSIS — N529 Male erectile dysfunction, unspecified: Secondary | ICD-10-CM | POA: Diagnosis not present

## 2017-02-10 DIAGNOSIS — M159 Polyosteoarthritis, unspecified: Secondary | ICD-10-CM | POA: Diagnosis not present

## 2017-02-10 DIAGNOSIS — K219 Gastro-esophageal reflux disease without esophagitis: Secondary | ICD-10-CM | POA: Diagnosis not present

## 2017-02-10 DIAGNOSIS — E1122 Type 2 diabetes mellitus with diabetic chronic kidney disease: Secondary | ICD-10-CM | POA: Diagnosis not present

## 2017-02-11 DIAGNOSIS — H353132 Nonexudative age-related macular degeneration, bilateral, intermediate dry stage: Secondary | ICD-10-CM | POA: Diagnosis not present

## 2017-02-26 DIAGNOSIS — N319 Neuromuscular dysfunction of bladder, unspecified: Secondary | ICD-10-CM | POA: Diagnosis not present

## 2017-02-26 DIAGNOSIS — N401 Enlarged prostate with lower urinary tract symptoms: Secondary | ICD-10-CM | POA: Diagnosis not present

## 2017-02-26 DIAGNOSIS — R339 Retention of urine, unspecified: Secondary | ICD-10-CM | POA: Diagnosis not present

## 2017-03-15 DIAGNOSIS — M62542 Muscle wasting and atrophy, not elsewhere classified, left hand: Secondary | ICD-10-CM | POA: Diagnosis not present

## 2017-03-15 DIAGNOSIS — M62541 Muscle wasting and atrophy, not elsewhere classified, right hand: Secondary | ICD-10-CM | POA: Diagnosis not present

## 2017-03-15 DIAGNOSIS — R269 Unspecified abnormalities of gait and mobility: Secondary | ICD-10-CM | POA: Diagnosis not present

## 2017-03-15 DIAGNOSIS — G609 Hereditary and idiopathic neuropathy, unspecified: Secondary | ICD-10-CM | POA: Diagnosis not present

## 2017-04-01 DIAGNOSIS — M5416 Radiculopathy, lumbar region: Secondary | ICD-10-CM | POA: Diagnosis not present

## 2017-04-03 DIAGNOSIS — E7800 Pure hypercholesterolemia, unspecified: Secondary | ICD-10-CM | POA: Diagnosis not present

## 2017-04-03 DIAGNOSIS — M549 Dorsalgia, unspecified: Secondary | ICD-10-CM | POA: Diagnosis not present

## 2017-04-03 DIAGNOSIS — M159 Polyosteoarthritis, unspecified: Secondary | ICD-10-CM | POA: Diagnosis not present

## 2017-04-03 DIAGNOSIS — E1122 Type 2 diabetes mellitus with diabetic chronic kidney disease: Secondary | ICD-10-CM | POA: Diagnosis not present

## 2017-04-03 DIAGNOSIS — N529 Male erectile dysfunction, unspecified: Secondary | ICD-10-CM | POA: Diagnosis not present

## 2017-04-03 DIAGNOSIS — K219 Gastro-esophageal reflux disease without esophagitis: Secondary | ICD-10-CM | POA: Diagnosis not present

## 2017-04-03 DIAGNOSIS — R945 Abnormal results of liver function studies: Secondary | ICD-10-CM | POA: Diagnosis not present

## 2017-04-03 DIAGNOSIS — I10 Essential (primary) hypertension: Secondary | ICD-10-CM | POA: Diagnosis not present

## 2017-04-03 DIAGNOSIS — E119 Type 2 diabetes mellitus without complications: Secondary | ICD-10-CM | POA: Diagnosis not present

## 2017-04-03 DIAGNOSIS — K5909 Other constipation: Secondary | ICD-10-CM | POA: Diagnosis not present

## 2017-04-05 DIAGNOSIS — M62542 Muscle wasting and atrophy, not elsewhere classified, left hand: Secondary | ICD-10-CM | POA: Diagnosis not present

## 2017-04-05 DIAGNOSIS — M62541 Muscle wasting and atrophy, not elsewhere classified, right hand: Secondary | ICD-10-CM | POA: Diagnosis not present

## 2017-04-05 DIAGNOSIS — M5416 Radiculopathy, lumbar region: Secondary | ICD-10-CM | POA: Diagnosis not present

## 2017-04-05 DIAGNOSIS — R269 Unspecified abnormalities of gait and mobility: Secondary | ICD-10-CM | POA: Diagnosis not present

## 2017-06-05 DIAGNOSIS — E7800 Pure hypercholesterolemia, unspecified: Secondary | ICD-10-CM | POA: Diagnosis not present

## 2017-06-05 DIAGNOSIS — I10 Essential (primary) hypertension: Secondary | ICD-10-CM | POA: Diagnosis not present

## 2017-06-05 DIAGNOSIS — M159 Polyosteoarthritis, unspecified: Secondary | ICD-10-CM | POA: Diagnosis not present

## 2017-06-05 DIAGNOSIS — K5909 Other constipation: Secondary | ICD-10-CM | POA: Diagnosis not present

## 2017-06-05 DIAGNOSIS — N529 Male erectile dysfunction, unspecified: Secondary | ICD-10-CM | POA: Diagnosis not present

## 2017-06-05 DIAGNOSIS — E119 Type 2 diabetes mellitus without complications: Secondary | ICD-10-CM | POA: Diagnosis not present

## 2017-06-05 DIAGNOSIS — E1122 Type 2 diabetes mellitus with diabetic chronic kidney disease: Secondary | ICD-10-CM | POA: Diagnosis not present

## 2017-06-05 DIAGNOSIS — Z9181 History of falling: Secondary | ICD-10-CM | POA: Diagnosis not present

## 2017-06-05 DIAGNOSIS — K219 Gastro-esophageal reflux disease without esophagitis: Secondary | ICD-10-CM | POA: Diagnosis not present

## 2017-06-05 DIAGNOSIS — R945 Abnormal results of liver function studies: Secondary | ICD-10-CM | POA: Diagnosis not present

## 2017-07-06 DIAGNOSIS — N289 Disorder of kidney and ureter, unspecified: Secondary | ICD-10-CM | POA: Diagnosis not present

## 2017-07-06 DIAGNOSIS — M5416 Radiculopathy, lumbar region: Secondary | ICD-10-CM | POA: Diagnosis not present

## 2017-07-22 DIAGNOSIS — M5416 Radiculopathy, lumbar region: Secondary | ICD-10-CM | POA: Diagnosis not present

## 2017-07-22 DIAGNOSIS — M48061 Spinal stenosis, lumbar region without neurogenic claudication: Secondary | ICD-10-CM | POA: Diagnosis not present

## 2017-08-05 DIAGNOSIS — M48062 Spinal stenosis, lumbar region with neurogenic claudication: Secondary | ICD-10-CM | POA: Insufficient documentation

## 2017-08-05 DIAGNOSIS — M5416 Radiculopathy, lumbar region: Secondary | ICD-10-CM | POA: Diagnosis not present

## 2017-08-07 DIAGNOSIS — K5909 Other constipation: Secondary | ICD-10-CM | POA: Diagnosis not present

## 2017-08-07 DIAGNOSIS — N529 Male erectile dysfunction, unspecified: Secondary | ICD-10-CM | POA: Diagnosis not present

## 2017-08-07 DIAGNOSIS — Z9181 History of falling: Secondary | ICD-10-CM | POA: Diagnosis not present

## 2017-08-07 DIAGNOSIS — K219 Gastro-esophageal reflux disease without esophagitis: Secondary | ICD-10-CM | POA: Diagnosis not present

## 2017-08-07 DIAGNOSIS — I10 Essential (primary) hypertension: Secondary | ICD-10-CM | POA: Diagnosis not present

## 2017-08-07 DIAGNOSIS — E1122 Type 2 diabetes mellitus with diabetic chronic kidney disease: Secondary | ICD-10-CM | POA: Diagnosis not present

## 2017-08-07 DIAGNOSIS — E7800 Pure hypercholesterolemia, unspecified: Secondary | ICD-10-CM | POA: Diagnosis not present

## 2017-08-07 DIAGNOSIS — E119 Type 2 diabetes mellitus without complications: Secondary | ICD-10-CM | POA: Diagnosis not present

## 2017-08-07 DIAGNOSIS — Z1389 Encounter for screening for other disorder: Secondary | ICD-10-CM | POA: Diagnosis not present

## 2017-08-07 DIAGNOSIS — R945 Abnormal results of liver function studies: Secondary | ICD-10-CM | POA: Diagnosis not present

## 2017-08-27 DIAGNOSIS — M48062 Spinal stenosis, lumbar region with neurogenic claudication: Secondary | ICD-10-CM | POA: Diagnosis not present

## 2017-10-16 DIAGNOSIS — R945 Abnormal results of liver function studies: Secondary | ICD-10-CM | POA: Diagnosis not present

## 2017-10-16 DIAGNOSIS — E1122 Type 2 diabetes mellitus with diabetic chronic kidney disease: Secondary | ICD-10-CM | POA: Diagnosis not present

## 2017-10-16 DIAGNOSIS — N529 Male erectile dysfunction, unspecified: Secondary | ICD-10-CM | POA: Diagnosis not present

## 2017-10-16 DIAGNOSIS — I10 Essential (primary) hypertension: Secondary | ICD-10-CM | POA: Diagnosis not present

## 2017-10-16 DIAGNOSIS — M159 Polyosteoarthritis, unspecified: Secondary | ICD-10-CM | POA: Diagnosis not present

## 2017-10-16 DIAGNOSIS — K5909 Other constipation: Secondary | ICD-10-CM | POA: Diagnosis not present

## 2017-10-16 DIAGNOSIS — E119 Type 2 diabetes mellitus without complications: Secondary | ICD-10-CM | POA: Diagnosis not present

## 2017-10-16 DIAGNOSIS — E7800 Pure hypercholesterolemia, unspecified: Secondary | ICD-10-CM | POA: Diagnosis not present

## 2017-10-16 DIAGNOSIS — K219 Gastro-esophageal reflux disease without esophagitis: Secondary | ICD-10-CM | POA: Diagnosis not present

## 2017-10-16 DIAGNOSIS — Z23 Encounter for immunization: Secondary | ICD-10-CM | POA: Diagnosis not present

## 2017-11-25 DIAGNOSIS — M48062 Spinal stenosis, lumbar region with neurogenic claudication: Secondary | ICD-10-CM | POA: Diagnosis not present

## 2017-11-29 DIAGNOSIS — M48062 Spinal stenosis, lumbar region with neurogenic claudication: Secondary | ICD-10-CM | POA: Diagnosis not present

## 2017-12-15 DIAGNOSIS — Z1211 Encounter for screening for malignant neoplasm of colon: Secondary | ICD-10-CM | POA: Diagnosis not present

## 2017-12-15 DIAGNOSIS — D126 Benign neoplasm of colon, unspecified: Secondary | ICD-10-CM | POA: Diagnosis not present

## 2017-12-15 DIAGNOSIS — D122 Benign neoplasm of ascending colon: Secondary | ICD-10-CM | POA: Diagnosis not present

## 2017-12-15 DIAGNOSIS — K5730 Diverticulosis of large intestine without perforation or abscess without bleeding: Secondary | ICD-10-CM | POA: Diagnosis not present

## 2017-12-17 DIAGNOSIS — I10 Essential (primary) hypertension: Secondary | ICD-10-CM | POA: Diagnosis not present

## 2017-12-17 DIAGNOSIS — R945 Abnormal results of liver function studies: Secondary | ICD-10-CM | POA: Diagnosis not present

## 2017-12-17 DIAGNOSIS — E1122 Type 2 diabetes mellitus with diabetic chronic kidney disease: Secondary | ICD-10-CM | POA: Diagnosis not present

## 2017-12-17 DIAGNOSIS — K219 Gastro-esophageal reflux disease without esophagitis: Secondary | ICD-10-CM | POA: Diagnosis not present

## 2017-12-17 DIAGNOSIS — E7800 Pure hypercholesterolemia, unspecified: Secondary | ICD-10-CM | POA: Diagnosis not present

## 2017-12-17 DIAGNOSIS — M159 Polyosteoarthritis, unspecified: Secondary | ICD-10-CM | POA: Diagnosis not present

## 2017-12-17 DIAGNOSIS — E119 Type 2 diabetes mellitus without complications: Secondary | ICD-10-CM | POA: Diagnosis not present

## 2017-12-17 DIAGNOSIS — N529 Male erectile dysfunction, unspecified: Secondary | ICD-10-CM | POA: Diagnosis not present

## 2017-12-17 DIAGNOSIS — K5909 Other constipation: Secondary | ICD-10-CM | POA: Diagnosis not present

## 2017-12-21 DIAGNOSIS — Z1212 Encounter for screening for malignant neoplasm of rectum: Secondary | ICD-10-CM | POA: Diagnosis not present

## 2017-12-30 DIAGNOSIS — E119 Type 2 diabetes mellitus without complications: Secondary | ICD-10-CM | POA: Diagnosis not present

## 2017-12-30 DIAGNOSIS — M159 Polyosteoarthritis, unspecified: Secondary | ICD-10-CM | POA: Diagnosis not present

## 2017-12-30 DIAGNOSIS — R945 Abnormal results of liver function studies: Secondary | ICD-10-CM | POA: Diagnosis not present

## 2017-12-30 DIAGNOSIS — I10 Essential (primary) hypertension: Secondary | ICD-10-CM | POA: Diagnosis not present

## 2017-12-30 DIAGNOSIS — E7800 Pure hypercholesterolemia, unspecified: Secondary | ICD-10-CM | POA: Diagnosis not present

## 2017-12-30 DIAGNOSIS — N529 Male erectile dysfunction, unspecified: Secondary | ICD-10-CM | POA: Diagnosis not present

## 2017-12-30 DIAGNOSIS — K5909 Other constipation: Secondary | ICD-10-CM | POA: Diagnosis not present

## 2017-12-30 DIAGNOSIS — E1122 Type 2 diabetes mellitus with diabetic chronic kidney disease: Secondary | ICD-10-CM | POA: Diagnosis not present

## 2017-12-30 DIAGNOSIS — K219 Gastro-esophageal reflux disease without esophagitis: Secondary | ICD-10-CM | POA: Diagnosis not present

## 2018-01-10 DIAGNOSIS — M48062 Spinal stenosis, lumbar region with neurogenic claudication: Secondary | ICD-10-CM | POA: Diagnosis not present

## 2018-01-13 DIAGNOSIS — R945 Abnormal results of liver function studies: Secondary | ICD-10-CM | POA: Diagnosis not present

## 2018-01-13 DIAGNOSIS — J208 Acute bronchitis due to other specified organisms: Secondary | ICD-10-CM | POA: Diagnosis not present

## 2018-01-13 DIAGNOSIS — M159 Polyosteoarthritis, unspecified: Secondary | ICD-10-CM | POA: Diagnosis not present

## 2018-01-13 DIAGNOSIS — K219 Gastro-esophageal reflux disease without esophagitis: Secondary | ICD-10-CM | POA: Diagnosis not present

## 2018-01-13 DIAGNOSIS — N529 Male erectile dysfunction, unspecified: Secondary | ICD-10-CM | POA: Diagnosis not present

## 2018-01-13 DIAGNOSIS — E1122 Type 2 diabetes mellitus with diabetic chronic kidney disease: Secondary | ICD-10-CM | POA: Diagnosis not present

## 2018-01-13 DIAGNOSIS — E119 Type 2 diabetes mellitus without complications: Secondary | ICD-10-CM | POA: Diagnosis not present

## 2018-01-13 DIAGNOSIS — K5909 Other constipation: Secondary | ICD-10-CM | POA: Diagnosis not present

## 2018-01-13 DIAGNOSIS — E7800 Pure hypercholesterolemia, unspecified: Secondary | ICD-10-CM | POA: Diagnosis not present

## 2018-02-21 DIAGNOSIS — M48062 Spinal stenosis, lumbar region with neurogenic claudication: Secondary | ICD-10-CM | POA: Diagnosis not present

## 2018-02-24 DIAGNOSIS — E1122 Type 2 diabetes mellitus with diabetic chronic kidney disease: Secondary | ICD-10-CM | POA: Diagnosis not present

## 2018-02-24 DIAGNOSIS — E7800 Pure hypercholesterolemia, unspecified: Secondary | ICD-10-CM | POA: Diagnosis not present

## 2018-02-24 DIAGNOSIS — G629 Polyneuropathy, unspecified: Secondary | ICD-10-CM | POA: Insufficient documentation

## 2018-02-24 DIAGNOSIS — R945 Abnormal results of liver function studies: Secondary | ICD-10-CM | POA: Diagnosis not present

## 2018-02-24 DIAGNOSIS — K5909 Other constipation: Secondary | ICD-10-CM | POA: Diagnosis not present

## 2018-02-24 DIAGNOSIS — I441 Atrioventricular block, second degree: Secondary | ICD-10-CM

## 2018-02-24 DIAGNOSIS — E119 Type 2 diabetes mellitus without complications: Secondary | ICD-10-CM | POA: Diagnosis not present

## 2018-02-24 DIAGNOSIS — K5900 Constipation, unspecified: Secondary | ICD-10-CM

## 2018-02-24 DIAGNOSIS — M159 Polyosteoarthritis, unspecified: Secondary | ICD-10-CM | POA: Diagnosis not present

## 2018-02-24 DIAGNOSIS — K219 Gastro-esophageal reflux disease without esophagitis: Secondary | ICD-10-CM | POA: Diagnosis not present

## 2018-02-24 DIAGNOSIS — N529 Male erectile dysfunction, unspecified: Secondary | ICD-10-CM | POA: Diagnosis not present

## 2018-02-24 HISTORY — DX: Polyneuropathy, unspecified: G62.9

## 2018-02-24 HISTORY — DX: Atrioventricular block, second degree: I44.1

## 2018-02-24 HISTORY — DX: Constipation, unspecified: K59.00

## 2018-02-25 ENCOUNTER — Ambulatory Visit (INDEPENDENT_AMBULATORY_CARE_PROVIDER_SITE_OTHER): Payer: Medicare HMO | Admitting: Cardiology

## 2018-02-25 ENCOUNTER — Encounter: Payer: Self-pay | Admitting: Cardiology

## 2018-02-25 VITALS — BP 140/62 | HR 117 | Ht 71.0 in | Wt 227.1 lb

## 2018-02-25 DIAGNOSIS — Z6834 Body mass index (BMI) 34.0-34.9, adult: Secondary | ICD-10-CM | POA: Diagnosis not present

## 2018-02-25 DIAGNOSIS — N183 Chronic kidney disease, stage 3 unspecified: Secondary | ICD-10-CM

## 2018-02-25 DIAGNOSIS — E1122 Type 2 diabetes mellitus with diabetic chronic kidney disease: Secondary | ICD-10-CM | POA: Diagnosis not present

## 2018-02-25 DIAGNOSIS — I441 Atrioventricular block, second degree: Secondary | ICD-10-CM | POA: Diagnosis not present

## 2018-02-25 DIAGNOSIS — I10 Essential (primary) hypertension: Secondary | ICD-10-CM | POA: Diagnosis not present

## 2018-02-25 DIAGNOSIS — I495 Sick sinus syndrome: Secondary | ICD-10-CM

## 2018-02-25 HISTORY — DX: Type 2 diabetes mellitus with diabetic chronic kidney disease: E11.22

## 2018-02-25 HISTORY — DX: Body mass index (BMI) 34.0-34.9, adult: Z68.34

## 2018-02-25 HISTORY — DX: Chronic kidney disease, stage 3 unspecified: N18.30

## 2018-02-25 NOTE — Progress Notes (Signed)
Cardiology Office Note:    Date:  02/25/2018   ID:  Jared Moody, DOB 08-21-1946, MRN [id number]  PCP:  Cher Nakai, MD  Cardiologist:  Jenean Lindau, MD   Referring MD: No ref. provider found    ASSESSMENT:    1. Second degree AV block, Mobitz type I   2. Hypertension, unspecified type   3. Adult BMI 34.0-34.9 kg/sq m   4. CKD stage 3 secondary to diabetes ([place])   5. Tachy-brady syndrome ([place])    PLAN:    In order of problems listed above:  1. I discussed my findings with the patient at extensive length.  Patient obviously has disease and pathology affecting his cardiac conduction system.  Fortunately he is completely asymptomatic and his QRSs are narrow.  He mentions to me that at times his heart rate goes into the 140s and 150s and a visiting nurse found it to be in the 150s and called the EMS.  By the time they arrived it was normal.  I suspect he has an element of tachybradycardia syndrome and will need therapy for this.  In reference to this I think he could be a possible candidate for a permanent pacemaker.  Though his QRSs are narrow and is asymptomatic I think he will need treatment for his tachyarrhythmias which will make [place] consider him for pacemaker therapy.  I asked him to restrict his driving.  He will be in given an appointment with our electrophysiology colleagues for this evaluation as soon as possible.  Further recommendations will be made per the electrophysiology assessment.  Nearest emergency room for any significant concerns.  I advised him not to drive until evaluated by electrophysiology based on the above findings.  His blood pressure is stable.  We will also obtain copy of his blood work from his primary care physician.   Medication Adjustments/Labs and Tests Ordered: Current medicines are reviewed at length with the patient today.  Concerns regarding medicines are outlined above.  Orders Placed This Encounter  Procedures  . Ambulatory referral to Cardiac  Electrophysiology  . EKG 12-Lead   No orders of the defined types were placed in this encounter.    History of Present Illness:    Jared Moody is a 72 y.o. male who is being seen today for the evaluation of abnormal EKG and AV block at the request of Dr. Truman Hayward.  Patient is a pleasant 72 year old male.  He has past medical history of essential hypertension.  His records mention that he has renal insufficiency and diet-controlled diabetes mellitus.  He was referred because his primary care physician found him to have a second-degree AV block with a narrow QRS.  The patient is completely asymptomatic.  He is an active gentleman.  He has issues with neuropathy in his lower extremity which is why he does not ambulate much.  No dizziness no syncope or any such symptoms.  No chest pain.  At the time of my evaluation, the patient is alert awake oriented and in no distress.  Past Medical History:  Diagnosis Date  . Abnormal liver function test   . Adult BMI 34.0-34.9 kg/sq m 02/25/2018  . Back pain   . CKD stage 3 secondary to diabetes ([place]) 02/25/2018  . DM (diabetes mellitus), type 2 ([place])   . Former smoker   . GERD (gastroesophageal reflux disease)   . HTN (hypertension)   . Hypercholesterolemia   . Hyperlipidemia   . Organic erectile dysfunction   .  Osteoarthritis   . Peripheral neuropathy 02/24/2018  . Renal insufficiency     Past Surgical History:  Procedure Laterality Date  . CATARACT EXTRACTION, BILATERAL      Current Medications: Current Meds  Medication Sig  . aspirin EC 81 MG tablet Take 81 mg by mouth daily.  Marland Kitchen azithromycin (ZITHROMAX) 250 MG tablet TAKE 2 TABLETS BY MOUTH TODAY, THEN TAKE 1 TABLET DAILY FOR 4 DAYS  . gabapentin (NEURONTIN) 300 MG capsule TAKE 1 CAPSULE BY MOUTH 2 TIMES A DAY FOR 1 WEEK THEN 1 CAPSULE 3 TIMES A DAY.  Marland Kitchen GAVILYTE-N WITH FLAVOR PACK 420 g solution See admin instructions.  Marland Kitchen lisinopril (PRINIVIL,ZESTRIL) 10 MG tablet Take 10 mg by mouth daily.  .  simvastatin (ZOCOR) 40 MG tablet Take 40 mg by mouth every evening.  . tamsulosin (FLOMAX) 0.4 MG CAPS capsule TAKE 1 CAPSULE BY MOUTH TWICE A DAY (1/2 HOUR AFTER BREAKFAST AND SUPPER)  . traMADol (ULTRAM) 50 MG tablet Take 50 mg by mouth every 6 (six) hours as needed.     Allergies:   Patient has no known allergies.   Social History   Socioeconomic History  . Marital status: Divorced    Spouse name: Not on file  . Number of children: Not on file  . Years of education: Not on file  . Highest education level: Not on file  Occupational History  . Not on file  Social Needs  . Financial resource strain: Not on file  . Food insecurity:    Worry: Not on file    Inability: Not on file  . Transportation needs:    Medical: Not on file    Non-medical: Not on file  Tobacco Use  . Smoking status: Former Research scientist (life sciences)  . Smokeless tobacco: Never Used  Substance and Sexual Activity  . Alcohol use: Yes  . Drug use: Never  . Sexual activity: Not on file  Lifestyle  . Physical activity:    Days per week: Not on file    Minutes per session: Not on file  . Stress: Not on file  Relationships  . Social connections:    Talks on phone: Not on file    Gets together: Not on file    Attends religious service: Not on file    Active member of club or organization: Not on file    Attends meetings of clubs or organizations: Not on file    Relationship status: Not on file  Other Topics Concern  . Not on file  Social History Narrative  . Not on file     Family History: The patient's family history includes Heart disease in his father and mother.  ROS:   Please see the history of present illness.    All other systems reviewed and are negative.  EKGs/Labs/Other Studies Reviewed:    The following studies were reviewed today: I discussed my findings with the patient at length.  EKG reveals narrow complex tachycardia.   Recent Labs: No results found for requested labs within last 8760 hours.    Recent Lipid Panel No results found for: CHOL, TRIG, HDL, CHOLHDL, VLDL, LDLCALC, LDLDIRECT  Physical Exam:    VS:  BP 140/62 (BP Location: Right Arm, Patient Position: Sitting, Cuff Size: Normal)   Pulse (!) 117   Ht 5\' 11"  (1.803 m)   Wt 227 lb 1.9 oz (103 kg)   SpO2 98%   BMI 31.68 kg/m     Wt Readings from Last 3 Encounters:  02/25/18  227 lb 1.9 oz (103 kg)     GEN: Patient is in no acute distress HEENT: Normal NECK: No JVD; No carotid bruits LYMPHATICS: No lymphadenopathy CARDIAC: S1 S2 regular, 2/6 systolic murmur at the apex. RESPIRATORY:  Clear to auscultation without rales, wheezing or rhonchi  ABDOMEN: Soft, non-tender, non-distended MUSCULOSKELETAL:  No edema; No deformity  SKIN: Warm and dry NEUROLOGIC:  Alert and oriented x 3 PSYCHIATRIC:  Normal affect    Signed, Jenean Lindau, MD  02/25/2018 3:30 PM    [place]

## 2018-02-25 NOTE — Patient Instructions (Signed)
Medication Instructions:  Your physician recommends that you continue on your current medications as directed. Please refer to the Current Medication list given to you today.  Labwork: None  Testing/Procedures: None  Follow-Up: Your physician recommends that you schedule a follow-up appointment in: To be determined post follow-up with electrophysiology.  Any Other Special Instructions Will Be Listed Below (If Applicable).   Referral Orders     Ambulatory referral to Cardiac Electrophysiology- Dr. Jackalyn Lombard office should call you for an appointment soon to schedule.    If you need a refill on your cardiac medications before your next appointment, please call your pharmacy.   [place], RN, BSN

## 2018-03-09 ENCOUNTER — Encounter: Payer: Self-pay | Admitting: Cardiology

## 2018-03-09 ENCOUNTER — Ambulatory Visit (INDEPENDENT_AMBULATORY_CARE_PROVIDER_SITE_OTHER): Payer: Medicare HMO | Admitting: Cardiology

## 2018-03-09 VITALS — BP 163/70 | HR 58 | Ht 71.0 in | Wt 225.1 lb

## 2018-03-09 DIAGNOSIS — I495 Sick sinus syndrome: Secondary | ICD-10-CM

## 2018-03-09 DIAGNOSIS — I441 Atrioventricular block, second degree: Secondary | ICD-10-CM

## 2018-03-09 DIAGNOSIS — I10 Essential (primary) hypertension: Secondary | ICD-10-CM | POA: Diagnosis not present

## 2018-03-09 NOTE — Patient Instructions (Addendum)
Medication Instructions:  Your physician recommends that you continue on your current medications as directed. Please refer to the Current Medication list given to you today.  Labwork: None ordered  Testing/Procedures: Your physician has recommended that you wear an event monitor. Event monitors are medical devices that record the heart's electrical activity. Doctors most often [place] these monitors to diagnose arrhythmias. Arrhythmias are problems with the speed or rhythm of the heartbeat. The monitor is a small, portable device. You can wear one while you do your normal daily activities. This is usually used to diagnose what is causing palpitations/syncope (passing out).  Follow-Up: Your physician recommends that you schedule a follow-up appointment in: 3 months with Dr. Curt Bears.   * If you need a refill on your cardiac medications before your next appointment, please call your pharmacy.   *Please note that any paperwork needing to be filled out by the provider will need to be addressed at the front desk prior to seeing the provider. Please note that any FMLA, disability or other documents regarding health condition is subject to a $25.00 charge that must be received prior to completion of paperwork in the form of a money order or check.  Thank you for choosing [facility]!!   Trinidad Curet, RN [phone]  Any Other Special Instructions Will Be Listed Below (If Applicable).   Cardiac Event Monitoring A cardiac event monitor is a small recording device that is used to detect abnormal heart rhythms (arrhythmias). The monitor is used to record your heart rhythm when you have symptoms, such as:  Fast heartbeats (palpitations), such as heart racing or fluttering.  Dizziness.  Fainting or light-headedness.  Unexplained weakness.  Some monitors are wired to electrodes placed on your chest. Electrodes are flat, sticky disks that attach to your skin. Other monitors may be hand-held or  worn on the wrist. The monitor can be worn for up to 30 days. If the monitor is attached to your chest, a technician will prepare your chest for the electrode placement and show you how to work the monitor. Take time to practice using the monitor before you leave the office. Make sure you understand how to send the information from the monitor to your health care provider. In some cases, you may need to use a landline telephone instead of a cell phone. What are the risks? Generally, this device is safe to use, but it possible that the skin under the electrodes will become irritated. How to use your cardiac event monitor  Wear your monitor at all times, except when you are in water: ? Do not let the monitor get wet. ? Take the monitor off when you bathe. Do not swim or use a hot tub with it on.  Keep your skin clean. Do not put body lotion or moisturizer on your chest.  Change the electrodes as told by your health care provider or any time they stop sticking to your skin. You may need to use medical tape to keep them on.  Try to put the electrodes in slightly different places on your chest to help prevent skin irritation. They must remain in the area under your left breast and in the upper right section of your chest.  Make sure the monitor is safely clipped to your clothing or in a location close to your body that your health care provider recommends.  Press the button to record as soon as you feel heart-related symptoms, such as: ? Dizziness. ? Weakness. ? Light-headedness. ?  Palpitations. ? Thumping or pounding in your chest. ? Shortness of breath. ? Unexplained weakness.  Keep a diary of your activities, such as walking, doing chores, and taking medicine. It is very important to note what you were doing when you pushed the button to record your symptoms. This will help your health care provider determine what might be contributing to your symptoms.  Send the recorded information as  recommended by your health care provider. It may take some time for your health care provider to process the results.  Change the batteries as told by your health care provider.  Keep electronic devices away from your monitor. This includes: ? Tablets. ? MP3 players. ? Cell phones.  While wearing your monitor you should avoid: ? Electric blankets. ? Armed forces operational officer. ? Electric toothbrushes. ? Microwave ovens. ? Magnets. ? Metal detectors. Get help right away if:  You have chest pain.  You have extreme difficulty breathing or shortness of breath.  You develop a very fast heartbeat that persists.  You develop dizziness that does not go away.  You faint or constantly feel like you are about to faint. Summary  A cardiac event monitor is a small recording device that is used to help detect abnormal heart rhythms (arrhythmias).  The monitor is used to record your heart rhythm when you have heart-related symptoms.  Make sure you understand how to send the information from the monitor to your health care provider.  It is important to press the button on the monitor when you have any heart-related symptoms.  Keep a diary of your activities, such as walking, doing chores, and taking medicine. It is very important to note what you were doing when you pushed the button to record your symptoms. This will help your health care provider learn what might be causing your symptoms. This information is not intended to replace advice given to you by your health care provider. Make sure you discuss any questions you have with your health care provider. Document Released: 08/11/2008 Document Revised: 10/17/2016 Document Reviewed: 10/17/2016 Elsevier Interactive Patient Education  2017 [employer].

## 2018-03-09 NOTE — Progress Notes (Signed)
Electrophysiology Office Note   Date:  03/09/2018   ID:  Jared Moody, Jared Moody August 25, 1946, MRN [id number]  PCP:  Cher Nakai, MD  Cardiologist:  Revankar Primary Electrophysiologist:  Will Meredith Leeds, MD    Chief Complaint  Patient presents with  . Advice Only    Tachycardia-bradycardia syndrome/Discuss pacemaker     History of Present Illness: Jared Moody is a 72 y.o. male who is being seen today for the evaluation of heart block at the request of Sunny Schlein Revankar. Presenting today for electrophysiology evaluation.  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  He was referred to cardiology as his primary physician found him to be in second-degree AV block with a narrow QRS complex.   Today, he denies symptoms of palpitations, chest pain, shortness of breath, orthopnea, PND, lower extremity edema, claudication, dizziness, presyncope, syncope, bleeding, or neurologic sequela. The patient is tolerating medications without difficulties.  He feels well.  He is able to do all of his daily activities.  He continues to exercise at the gym lifting weights.   Past Medical History:  Diagnosis Date  . Abnormal liver function test   . Adult BMI 34.0-34.9 kg/sq m 02/25/2018  . Back pain   . CKD stage 3 secondary to diabetes ([place]) 02/25/2018  . Constipation 02/24/2018  . DM (diabetes mellitus), type 2 ([place])   . Former smoker   . GERD (gastroesophageal reflux disease)   . HTN (hypertension)   . Hypercholesterolemia   . Hyperlipidemia   . Organic erectile dysfunction   . Osteoarthritis   . Peripheral neuropathy 02/24/2018  . Renal insufficiency   . Second degree AV block, Mobitz type I 02/24/2018   Past Surgical History:  Procedure Laterality Date  . CATARACT EXTRACTION, BILATERAL       Current Outpatient Medications  Medication Sig Dispense Refill  . aspirin EC 81 MG tablet Take 81 mg by mouth daily.    Marland Kitchen gabapentin (NEURONTIN) 300  MG capsule TAKE 1 CAPSULE BY MOUTH 2 TIMES A DAY FOR 1 WEEK THEN 1 CAPSULE 3 TIMES A DAY.  3  . lisinopril (PRINIVIL,ZESTRIL) 10 MG tablet Take 10 mg by mouth daily.  12  . simvastatin (ZOCOR) 40 MG tablet Take 40 mg by mouth every evening.  1  . tamsulosin (FLOMAX) 0.4 MG CAPS capsule TAKE 1 CAPSULE BY MOUTH TWICE A DAY (1/2 HOUR AFTER BREAKFAST AND SUPPER)  3   No current facility-administered medications for this visit.     Allergies:   Patient has no known allergies.   Social History:  The patient  reports that he has quit smoking. He has never used smokeless tobacco. He reports that he drinks alcohol. He reports that he does not use drugs.   Family History:  The patient's family history includes Heart disease in his father and mother.    ROS:  Please see the history of present illness.   Otherwise, review of systems is positive for none.   All other systems are reviewed and negative.    PHYSICAL EXAM: VS:  BP (!) 163/70   Pulse (!) 58   Ht 5\' 11"  (1.803 m)   Wt 225 lb 1.9 oz (102.1 kg)   SpO2 96%   BMI 31.40 kg/m  , BMI Body mass index is 31.4 kg/m. GEN: Well nourished, well developed, in no acute distress  HEENT: normal  Neck: no JVD, carotid bruits, or masses Cardiac: RRR; no  murmurs, rubs, or gallops,no edema  Respiratory:  clear to auscultation bilaterally, normal work of breathing GI: soft, nontender, nondistended, + BS MS: no deformity or atrophy  Skin: warm and dry Neuro:  Strength and sensation are intact Psych: euthymic mood, full affect  EKG:  EKG is ordered today. Personal review of the ekg ordered shows sinus rhythm, mobitz 1 AV block, LAFB  Recent Labs: No results found for requested labs within last 8760 hours.    Lipid Panel  No results found for: CHOL, TRIG, HDL, CHOLHDL, VLDL, LDLCALC, LDLDIRECT   Wt Readings from Last 3 Encounters:  03/09/18 225 lb 1.9 oz (102.1 kg)  02/25/18 227 lb 1.9 oz (103 kg)      Other studies Reviewed: Additional  studies/ records that were reviewed today include: Epic notes   ASSESSMENT AND PLAN:  1.  Mobitz 1 AV block: Only he is asymptomatic.  His EKG shows Mobitz 1 AV block.  He has had some tachycardia in the past.  To get a burden of his tachycardia to see if there is a reason to treat, we will fit him with a cardiac monitor.  As he is asymptomatic, would prefer to hold off on pacemaker therapy.  Avoid all AV nodal blocking agents.  2.  Hypertension: Blood pressure is elevated today but has been normal in the past.  We will advised him to continue to check his blood pressure at home.  If it remains elevated, will potentially increase lisinopril.    Current medicines are reviewed at length with the patient today.   The patient does not have concerns regarding his medicines.  The following changes were made today:  none  Labs/ tests ordered today include:  Orders Placed This Encounter  Procedures  . Cardiac event monitor  . EKG 12-Lead     Disposition:   FU with Will Camnitz 3 months  Signed, Will Meredith Leeds, MD  03/09/2018 2:11 PM     [address] [place] [address] [phone] (office) [phone] (fax)

## 2018-03-09 NOTE — Addendum Note (Signed)
Addended by: Stanton Kidney on: 03/09/2018 04:29 PM   Modules accepted: Orders

## 2018-03-09 NOTE — Addendum Note (Signed)
Addended by: Stanton Kidney on: 03/09/2018 04:30 PM   Modules accepted: Orders

## 2018-03-10 DIAGNOSIS — R945 Abnormal results of liver function studies: Secondary | ICD-10-CM | POA: Diagnosis not present

## 2018-03-10 DIAGNOSIS — E7800 Pure hypercholesterolemia, unspecified: Secondary | ICD-10-CM | POA: Diagnosis not present

## 2018-03-10 DIAGNOSIS — I10 Essential (primary) hypertension: Secondary | ICD-10-CM | POA: Diagnosis not present

## 2018-03-10 DIAGNOSIS — M159 Polyosteoarthritis, unspecified: Secondary | ICD-10-CM | POA: Diagnosis not present

## 2018-03-10 DIAGNOSIS — E1122 Type 2 diabetes mellitus with diabetic chronic kidney disease: Secondary | ICD-10-CM | POA: Diagnosis not present

## 2018-03-10 DIAGNOSIS — N529 Male erectile dysfunction, unspecified: Secondary | ICD-10-CM | POA: Diagnosis not present

## 2018-03-10 DIAGNOSIS — K219 Gastro-esophageal reflux disease without esophagitis: Secondary | ICD-10-CM | POA: Diagnosis not present

## 2018-03-10 DIAGNOSIS — K5909 Other constipation: Secondary | ICD-10-CM | POA: Diagnosis not present

## 2018-03-10 DIAGNOSIS — E119 Type 2 diabetes mellitus without complications: Secondary | ICD-10-CM | POA: Diagnosis not present

## 2018-03-11 ENCOUNTER — Ambulatory Visit: Payer: Medicare HMO

## 2018-03-11 DIAGNOSIS — I441 Atrioventricular block, second degree: Secondary | ICD-10-CM | POA: Diagnosis not present

## 2018-03-11 DIAGNOSIS — I495 Sick sinus syndrome: Secondary | ICD-10-CM | POA: Diagnosis not present

## 2018-03-15 ENCOUNTER — Telehealth: Payer: Self-pay | Admitting: Cardiology

## 2018-03-15 DIAGNOSIS — M545 Low back pain: Secondary | ICD-10-CM | POA: Diagnosis not present

## 2018-03-15 DIAGNOSIS — M48062 Spinal stenosis, lumbar region with neurogenic claudication: Secondary | ICD-10-CM | POA: Diagnosis not present

## 2018-03-15 DIAGNOSIS — M4726 Other spondylosis with radiculopathy, lumbar region: Secondary | ICD-10-CM | POA: Diagnosis not present

## 2018-03-15 NOTE — Telephone Encounter (Signed)
This patient saw Dr. Curt Bears on 03/09/18 and is being fitted for an event monitor to evaluate tachy-brady and second degree heart block. I will forward this surgical clearance to Dr. Curt Bears for advice on proceeding with surgery before this workup is complete.  Dr. Curt Bears, please forward recommendations regarding surgical clearance to the preop pool.

## 2018-03-15 NOTE — Telephone Encounter (Signed)
   [facility] Pre-operative Risk Assessment    Request for surgical clearance:  1. What type of surgery is being performed?  Open Lumbar Laminectomy L3-L5   2. When is this surgery scheduled? 04/07/18   3. What type of clearance is required (medical clearance vs. Pharmacy clearance to hold med vs. Both)?  Both  4. Are there any medications that need to be held prior to surgery and how long? ASA 5 days prior   5. Practice name and name of physician performing surgery?  Dr. Gerilyn Nestle with [facility]  6. What is your office phone number? [phone]    7.   What is your office fax number? [phone]  8.   Anesthesia type (None, local, MAC, general) ?  None listed   Jared Moody L 03/15/2018, 3:26 PM  _________________________________________________________________   (provider comments below)

## 2018-03-16 NOTE — Telephone Encounter (Signed)
Intermediate risk for an intermediate risk procedure. OK to hold aspirin. Avoid all AV nodal blocking agents.

## 2018-03-21 ENCOUNTER — Telehealth: Payer: Self-pay | Admitting: Cardiology

## 2018-03-21 NOTE — Telephone Encounter (Signed)
New Message:      Pt states his tape from the holter monitor will not stay on because he sweats to much. Pt states he is supposed to wear it until 5/10.

## 2018-03-24 DIAGNOSIS — E119 Type 2 diabetes mellitus without complications: Secondary | ICD-10-CM | POA: Diagnosis not present

## 2018-03-24 DIAGNOSIS — N529 Male erectile dysfunction, unspecified: Secondary | ICD-10-CM | POA: Diagnosis not present

## 2018-03-24 DIAGNOSIS — R945 Abnormal results of liver function studies: Secondary | ICD-10-CM | POA: Diagnosis not present

## 2018-03-24 DIAGNOSIS — E1122 Type 2 diabetes mellitus with diabetic chronic kidney disease: Secondary | ICD-10-CM | POA: Diagnosis not present

## 2018-03-24 DIAGNOSIS — K5909 Other constipation: Secondary | ICD-10-CM | POA: Diagnosis not present

## 2018-03-24 DIAGNOSIS — K219 Gastro-esophageal reflux disease without esophagitis: Secondary | ICD-10-CM | POA: Diagnosis not present

## 2018-03-24 DIAGNOSIS — M159 Polyosteoarthritis, unspecified: Secondary | ICD-10-CM | POA: Diagnosis not present

## 2018-03-24 DIAGNOSIS — I10 Essential (primary) hypertension: Secondary | ICD-10-CM | POA: Diagnosis not present

## 2018-03-24 DIAGNOSIS — E7800 Pure hypercholesterolemia, unspecified: Secondary | ICD-10-CM | POA: Diagnosis not present

## 2018-03-29 NOTE — Telephone Encounter (Signed)
Phoned pt about monitor electrodes not sticking. He stated that [employer] sent him more of the electrodes and he just changed them out more often. Has sent the monitor back and said everything was fine after he got the extra supplies.

## 2018-04-01 DIAGNOSIS — Z01818 Encounter for other preprocedural examination: Secondary | ICD-10-CM | POA: Diagnosis not present

## 2018-04-01 DIAGNOSIS — J449 Chronic obstructive pulmonary disease, unspecified: Secondary | ICD-10-CM | POA: Diagnosis not present

## 2018-04-01 DIAGNOSIS — I10 Essential (primary) hypertension: Secondary | ICD-10-CM | POA: Diagnosis not present

## 2018-04-01 DIAGNOSIS — M48062 Spinal stenosis, lumbar region with neurogenic claudication: Secondary | ICD-10-CM | POA: Diagnosis not present

## 2018-04-07 DIAGNOSIS — M5416 Radiculopathy, lumbar region: Secondary | ICD-10-CM | POA: Diagnosis not present

## 2018-04-07 DIAGNOSIS — Z981 Arthrodesis status: Secondary | ICD-10-CM | POA: Diagnosis not present

## 2018-04-07 DIAGNOSIS — M48062 Spinal stenosis, lumbar region with neurogenic claudication: Secondary | ICD-10-CM | POA: Diagnosis not present

## 2018-04-08 DIAGNOSIS — M48062 Spinal stenosis, lumbar region with neurogenic claudication: Secondary | ICD-10-CM | POA: Diagnosis not present

## 2018-05-05 ENCOUNTER — Encounter: Payer: Self-pay | Admitting: *Deleted

## 2018-05-24 ENCOUNTER — Encounter: Payer: Self-pay | Admitting: Cardiology

## 2018-05-25 DIAGNOSIS — R945 Abnormal results of liver function studies: Secondary | ICD-10-CM | POA: Diagnosis not present

## 2018-05-25 DIAGNOSIS — M159 Polyosteoarthritis, unspecified: Secondary | ICD-10-CM | POA: Diagnosis not present

## 2018-05-25 DIAGNOSIS — M549 Dorsalgia, unspecified: Secondary | ICD-10-CM | POA: Diagnosis not present

## 2018-05-25 DIAGNOSIS — E1122 Type 2 diabetes mellitus with diabetic chronic kidney disease: Secondary | ICD-10-CM | POA: Diagnosis not present

## 2018-05-25 DIAGNOSIS — Z87891 Personal history of nicotine dependence: Secondary | ICD-10-CM | POA: Diagnosis not present

## 2018-05-25 DIAGNOSIS — I10 Essential (primary) hypertension: Secondary | ICD-10-CM | POA: Diagnosis not present

## 2018-05-25 DIAGNOSIS — G609 Hereditary and idiopathic neuropathy, unspecified: Secondary | ICD-10-CM | POA: Diagnosis not present

## 2018-05-25 DIAGNOSIS — K219 Gastro-esophageal reflux disease without esophagitis: Secondary | ICD-10-CM | POA: Diagnosis not present

## 2018-05-25 DIAGNOSIS — N529 Male erectile dysfunction, unspecified: Secondary | ICD-10-CM | POA: Diagnosis not present

## 2018-05-25 DIAGNOSIS — K5909 Other constipation: Secondary | ICD-10-CM | POA: Diagnosis not present

## 2018-06-08 ENCOUNTER — Ambulatory Visit: Payer: Medicare HMO | Admitting: Cardiology

## 2018-06-08 ENCOUNTER — Encounter: Payer: Self-pay | Admitting: Cardiology

## 2018-06-08 VITALS — BP 128/74 | HR 41 | Ht 71.0 in | Wt 218.4 lb

## 2018-06-08 DIAGNOSIS — I10 Essential (primary) hypertension: Secondary | ICD-10-CM

## 2018-06-08 DIAGNOSIS — I441 Atrioventricular block, second degree: Secondary | ICD-10-CM

## 2018-06-08 NOTE — Progress Notes (Signed)
Electrophysiology Office Note   Date:  06/08/2018   ID:  Jared, Moody 06-15-1946, MRN [id number]  PCP:  Cher Nakai, MD  Cardiologist:  Revankar Primary Electrophysiologist:  Jamarrion Budai Meredith Leeds, MD    No chief complaint on file.    History of Present Illness: Jared Moody is a 72 y.o. male who is being seen today for the evaluation of heart block at the request of Sunny Schlein Revankar. Presenting today for electrophysiology evaluation.  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  He was referred to cardiology as his primary physician found him to be in second-degree AV block with a narrow QRS complex.  Today, denies symptoms of palpitations, chest pain, shortness of breath, orthopnea, PND, lower extremity edema, claudication, dizziness, presyncope, syncope, bleeding, or neurologic sequela. The patient is tolerating medications without difficulties.  Overall he is doing well.  He continues to have no issues from his bradycardia.  He is able to exercise and can get his heart rate into the 1 teens without issue.  He recently had back surgery which is greatly reduced his back pain and he has restarted his exercise regimen.   Past Medical History:  Diagnosis Date  . Abnormal liver function test   . Adult BMI 34.0-34.9 kg/sq m 02/25/2018  . Back pain   . CKD stage 3 secondary to diabetes ([place]) 02/25/2018  . Constipation 02/24/2018  . DM (diabetes mellitus), type 2 ([place])   . Former smoker   . GERD (gastroesophageal reflux disease)   . HTN (hypertension)   . Hypercholesterolemia   . Hyperlipidemia   . Organic erectile dysfunction   . Osteoarthritis   . Peripheral neuropathy 02/24/2018  . Renal insufficiency   . Second degree AV block, Mobitz type I 02/24/2018   Past Surgical History:  Procedure Laterality Date  . CATARACT EXTRACTION, BILATERAL       Current Outpatient Medications  Medication Sig Dispense Refill  . aspirin EC 81  MG tablet Take 81 mg by mouth daily.    Marland Kitchen gabapentin (NEURONTIN) 300 MG capsule TAKE 1 CAPSULE BY MOUTH 2 TIMES A DAY FOR 1 WEEK THEN 1 CAPSULE 3 TIMES A DAY.  3  . lisinopril (PRINIVIL,ZESTRIL) 10 MG tablet Take 10 mg by mouth daily.  12  . simvastatin (ZOCOR) 40 MG tablet Take 40 mg by mouth every evening.  1  . tamsulosin (FLOMAX) 0.4 MG CAPS capsule TAKE 1 CAPSULE BY MOUTH TWICE A DAY (1/2 HOUR AFTER BREAKFAST AND SUPPER)  3   No current facility-administered medications for this visit.     Allergies:   Patient has no known allergies.   Social History:  The patient  reports that he has quit smoking. He has never used smokeless tobacco. He reports that he drinks alcohol. He reports that he does not use drugs.   Family History:  The patient's family history includes Heart disease in his father and mother.   ROS:  Please see the history of present illness.   Otherwise, review of systems is positive for none.   All other systems are reviewed and negative.   PHYSICAL EXAM: VS:  There were no vitals taken for this visit. , BMI There is no height or weight on file to calculate BMI. GEN: Well nourished, well developed, in no acute distress  HEENT: normal  Neck: no JVD, carotid bruits, or masses Cardiac: iRRR; no murmurs, rubs, or gallops,no edema  Respiratory:  clear to auscultation bilaterally, normal work of breathing GI: soft, nontender, nondistended, + BS MS: no deformity or atrophy  Skin: warm and dry Neuro:  Strength and sensation are intact Psych: euthymic mood, full affect  EKG:  EKG is not ordered today. Personal review of the ekg ordered 03/09/18 shows sinus rhythm, Mobitz 1 AV block  Recent Labs: No results found for requested labs within last 8760 hours.    Lipid Panel  No results found for: CHOL, TRIG, HDL, CHOLHDL, VLDL, LDLCALC, LDLDIRECT   Wt Readings from Last 3 Encounters:  03/09/18 225 lb 1.9 oz (102.1 kg)  02/25/18 227 lb 1.9 oz (103 kg)      Other  studies Reviewed: Additional studies/ records that were reviewed today include: Epic notes   ASSESSMENT AND PLAN:  1.  Mobitz 1 AV block: He is asymptomatic from this.  He continues to have a narrow QRS complex.  He is able to exercise without issue, and is able to get his heart rate into the 1 teens.  As he is asymptomatic, no indication for pacemaker at this time.  2.  Hypertension: Currently well controlled.  No changes.    Current medicines are reviewed at length with the patient today.   The patient does not have concerns regarding his medicines.  The following changes were made today: None  Labs/ tests ordered today include:  No orders of the defined types were placed in this encounter.    Disposition:   FU with Borden Thune 6 months  Signed, Jadi Deyarmin Meredith Leeds, MD  06/08/2018 6:52 AM     [facility] [address] [place]  [phone] (office) [phone] (fax)

## 2018-06-08 NOTE — Patient Instructions (Signed)
Medication Instructions:  Your physician recommends that you continue on your current medications as directed. Please refer to the Current Medication list given to you today.  * If you need a refill on your cardiac medications before your next appointment, please call your pharmacy.   Labwork: None ordered  Testing/Procedures: None ordered  Follow-Up: Your physician wants you to follow-up in: 6 months with Dr. Camnitz.  You will receive a reminder letter in the mail two months in advance. If you don't receive a letter, please call our office to schedule the follow-up appointment.  *Please note that any paperwork needing to be filled out by the provider will need to be addressed at the front desk prior to seeing the provider. Please note that any FMLA, disability or other documents regarding health condition is subject to a $25.00 charge that must be received prior to completion of paperwork in the form of a money order or check.  Thank you for choosing [facility]!!   Sherri Price, RN [phone]        

## 2018-07-19 DIAGNOSIS — Z0000 Encounter for general adult medical examination without abnormal findings: Secondary | ICD-10-CM | POA: Diagnosis not present

## 2018-07-19 DIAGNOSIS — Z125 Encounter for screening for malignant neoplasm of prostate: Secondary | ICD-10-CM | POA: Diagnosis not present

## 2018-07-19 DIAGNOSIS — E669 Obesity, unspecified: Secondary | ICD-10-CM | POA: Diagnosis not present

## 2018-07-19 DIAGNOSIS — Z1339 Encounter for screening examination for other mental health and behavioral disorders: Secondary | ICD-10-CM | POA: Diagnosis not present

## 2018-07-19 DIAGNOSIS — Z139 Encounter for screening, unspecified: Secondary | ICD-10-CM | POA: Diagnosis not present

## 2018-07-19 DIAGNOSIS — Z1331 Encounter for screening for depression: Secondary | ICD-10-CM | POA: Diagnosis not present

## 2018-07-19 DIAGNOSIS — Z9181 History of falling: Secondary | ICD-10-CM | POA: Diagnosis not present

## 2018-07-19 DIAGNOSIS — E785 Hyperlipidemia, unspecified: Secondary | ICD-10-CM | POA: Diagnosis not present

## 2018-07-28 DIAGNOSIS — Z125 Encounter for screening for malignant neoplasm of prostate: Secondary | ICD-10-CM | POA: Diagnosis not present

## 2018-07-28 DIAGNOSIS — N529 Male erectile dysfunction, unspecified: Secondary | ICD-10-CM | POA: Diagnosis not present

## 2018-07-28 DIAGNOSIS — E7800 Pure hypercholesterolemia, unspecified: Secondary | ICD-10-CM | POA: Diagnosis not present

## 2018-07-28 DIAGNOSIS — M159 Polyosteoarthritis, unspecified: Secondary | ICD-10-CM | POA: Diagnosis not present

## 2018-07-28 DIAGNOSIS — E1122 Type 2 diabetes mellitus with diabetic chronic kidney disease: Secondary | ICD-10-CM | POA: Diagnosis not present

## 2018-07-28 DIAGNOSIS — Z87891 Personal history of nicotine dependence: Secondary | ICD-10-CM | POA: Diagnosis not present

## 2018-07-28 DIAGNOSIS — R945 Abnormal results of liver function studies: Secondary | ICD-10-CM | POA: Diagnosis not present

## 2018-07-28 DIAGNOSIS — G609 Hereditary and idiopathic neuropathy, unspecified: Secondary | ICD-10-CM | POA: Diagnosis not present

## 2018-07-28 DIAGNOSIS — M549 Dorsalgia, unspecified: Secondary | ICD-10-CM | POA: Diagnosis not present

## 2018-07-28 DIAGNOSIS — K219 Gastro-esophageal reflux disease without esophagitis: Secondary | ICD-10-CM | POA: Diagnosis not present

## 2018-07-28 DIAGNOSIS — K5909 Other constipation: Secondary | ICD-10-CM | POA: Diagnosis not present

## 2018-07-28 DIAGNOSIS — E119 Type 2 diabetes mellitus without complications: Secondary | ICD-10-CM | POA: Diagnosis not present

## 2018-08-11 DIAGNOSIS — N529 Male erectile dysfunction, unspecified: Secondary | ICD-10-CM | POA: Diagnosis not present

## 2018-08-11 DIAGNOSIS — Z23 Encounter for immunization: Secondary | ICD-10-CM | POA: Diagnosis not present

## 2018-08-11 DIAGNOSIS — K219 Gastro-esophageal reflux disease without esophagitis: Secondary | ICD-10-CM | POA: Diagnosis not present

## 2018-08-11 DIAGNOSIS — M549 Dorsalgia, unspecified: Secondary | ICD-10-CM | POA: Diagnosis not present

## 2018-08-11 DIAGNOSIS — R972 Elevated prostate specific antigen [PSA]: Secondary | ICD-10-CM | POA: Diagnosis not present

## 2018-08-11 DIAGNOSIS — K5909 Other constipation: Secondary | ICD-10-CM | POA: Diagnosis not present

## 2018-08-11 DIAGNOSIS — R945 Abnormal results of liver function studies: Secondary | ICD-10-CM | POA: Diagnosis not present

## 2018-08-11 DIAGNOSIS — E1122 Type 2 diabetes mellitus with diabetic chronic kidney disease: Secondary | ICD-10-CM | POA: Diagnosis not present

## 2018-08-11 DIAGNOSIS — M159 Polyosteoarthritis, unspecified: Secondary | ICD-10-CM | POA: Diagnosis not present

## 2018-08-19 DIAGNOSIS — N529 Male erectile dysfunction, unspecified: Secondary | ICD-10-CM | POA: Diagnosis not present

## 2018-08-19 DIAGNOSIS — E1122 Type 2 diabetes mellitus with diabetic chronic kidney disease: Secondary | ICD-10-CM | POA: Diagnosis not present

## 2018-08-19 DIAGNOSIS — R972 Elevated prostate specific antigen [PSA]: Secondary | ICD-10-CM | POA: Diagnosis not present

## 2018-08-19 DIAGNOSIS — K5909 Other constipation: Secondary | ICD-10-CM | POA: Diagnosis not present

## 2018-08-19 DIAGNOSIS — R945 Abnormal results of liver function studies: Secondary | ICD-10-CM | POA: Diagnosis not present

## 2018-08-19 DIAGNOSIS — K219 Gastro-esophageal reflux disease without esophagitis: Secondary | ICD-10-CM | POA: Diagnosis not present

## 2018-08-19 DIAGNOSIS — G609 Hereditary and idiopathic neuropathy, unspecified: Secondary | ICD-10-CM | POA: Diagnosis not present

## 2018-08-19 DIAGNOSIS — M549 Dorsalgia, unspecified: Secondary | ICD-10-CM | POA: Diagnosis not present

## 2018-08-19 DIAGNOSIS — M159 Polyosteoarthritis, unspecified: Secondary | ICD-10-CM | POA: Diagnosis not present

## 2018-08-26 DIAGNOSIS — N401 Enlarged prostate with lower urinary tract symptoms: Secondary | ICD-10-CM | POA: Diagnosis not present

## 2018-08-26 DIAGNOSIS — R972 Elevated prostate specific antigen [PSA]: Secondary | ICD-10-CM | POA: Diagnosis not present

## 2018-08-26 DIAGNOSIS — R339 Retention of urine, unspecified: Secondary | ICD-10-CM | POA: Diagnosis not present

## 2018-09-22 DIAGNOSIS — N401 Enlarged prostate with lower urinary tract symptoms: Secondary | ICD-10-CM | POA: Diagnosis not present

## 2018-09-22 DIAGNOSIS — R972 Elevated prostate specific antigen [PSA]: Secondary | ICD-10-CM | POA: Diagnosis not present

## 2018-09-27 DIAGNOSIS — N529 Male erectile dysfunction, unspecified: Secondary | ICD-10-CM | POA: Diagnosis not present

## 2018-09-27 DIAGNOSIS — K5909 Other constipation: Secondary | ICD-10-CM | POA: Diagnosis not present

## 2018-09-27 DIAGNOSIS — M159 Polyosteoarthritis, unspecified: Secondary | ICD-10-CM | POA: Diagnosis not present

## 2018-09-27 DIAGNOSIS — M549 Dorsalgia, unspecified: Secondary | ICD-10-CM | POA: Diagnosis not present

## 2018-09-27 DIAGNOSIS — R972 Elevated prostate specific antigen [PSA]: Secondary | ICD-10-CM | POA: Diagnosis not present

## 2018-09-27 DIAGNOSIS — G609 Hereditary and idiopathic neuropathy, unspecified: Secondary | ICD-10-CM | POA: Diagnosis not present

## 2018-09-27 DIAGNOSIS — E1122 Type 2 diabetes mellitus with diabetic chronic kidney disease: Secondary | ICD-10-CM | POA: Diagnosis not present

## 2018-09-27 DIAGNOSIS — R945 Abnormal results of liver function studies: Secondary | ICD-10-CM | POA: Diagnosis not present

## 2018-09-27 DIAGNOSIS — K219 Gastro-esophageal reflux disease without esophagitis: Secondary | ICD-10-CM | POA: Diagnosis not present

## 2018-09-27 DIAGNOSIS — I10 Essential (primary) hypertension: Secondary | ICD-10-CM | POA: Diagnosis not present

## 2018-09-29 DIAGNOSIS — C61 Malignant neoplasm of prostate: Secondary | ICD-10-CM | POA: Diagnosis not present

## 2018-10-04 DIAGNOSIS — C61 Malignant neoplasm of prostate: Secondary | ICD-10-CM | POA: Diagnosis not present

## 2018-10-18 DIAGNOSIS — C61 Malignant neoplasm of prostate: Secondary | ICD-10-CM | POA: Diagnosis not present

## 2018-11-29 DIAGNOSIS — N529 Male erectile dysfunction, unspecified: Secondary | ICD-10-CM | POA: Diagnosis not present

## 2018-11-29 DIAGNOSIS — K219 Gastro-esophageal reflux disease without esophagitis: Secondary | ICD-10-CM | POA: Diagnosis not present

## 2018-11-29 DIAGNOSIS — E7800 Pure hypercholesterolemia, unspecified: Secondary | ICD-10-CM | POA: Diagnosis not present

## 2018-11-29 DIAGNOSIS — E1122 Type 2 diabetes mellitus with diabetic chronic kidney disease: Secondary | ICD-10-CM | POA: Diagnosis not present

## 2018-11-29 DIAGNOSIS — K5909 Other constipation: Secondary | ICD-10-CM | POA: Diagnosis not present

## 2018-11-29 DIAGNOSIS — R972 Elevated prostate specific antigen [PSA]: Secondary | ICD-10-CM | POA: Diagnosis not present

## 2018-11-29 DIAGNOSIS — I10 Essential (primary) hypertension: Secondary | ICD-10-CM | POA: Diagnosis not present

## 2018-11-29 DIAGNOSIS — R945 Abnormal results of liver function studies: Secondary | ICD-10-CM | POA: Diagnosis not present

## 2018-11-29 DIAGNOSIS — E118 Type 2 diabetes mellitus with unspecified complications: Secondary | ICD-10-CM | POA: Diagnosis not present

## 2019-01-30 DIAGNOSIS — K219 Gastro-esophageal reflux disease without esophagitis: Secondary | ICD-10-CM | POA: Diagnosis not present

## 2019-01-30 DIAGNOSIS — I10 Essential (primary) hypertension: Secondary | ICD-10-CM | POA: Diagnosis not present

## 2019-01-30 DIAGNOSIS — K5909 Other constipation: Secondary | ICD-10-CM | POA: Diagnosis not present

## 2019-01-30 DIAGNOSIS — E1122 Type 2 diabetes mellitus with diabetic chronic kidney disease: Secondary | ICD-10-CM | POA: Diagnosis not present

## 2019-01-30 DIAGNOSIS — G609 Hereditary and idiopathic neuropathy, unspecified: Secondary | ICD-10-CM | POA: Diagnosis not present

## 2019-01-30 DIAGNOSIS — M159 Polyosteoarthritis, unspecified: Secondary | ICD-10-CM | POA: Diagnosis not present

## 2019-01-30 DIAGNOSIS — R972 Elevated prostate specific antigen [PSA]: Secondary | ICD-10-CM | POA: Diagnosis not present

## 2019-01-30 DIAGNOSIS — R945 Abnormal results of liver function studies: Secondary | ICD-10-CM | POA: Diagnosis not present

## 2019-01-30 DIAGNOSIS — M549 Dorsalgia, unspecified: Secondary | ICD-10-CM | POA: Diagnosis not present

## 2019-01-30 DIAGNOSIS — N529 Male erectile dysfunction, unspecified: Secondary | ICD-10-CM | POA: Diagnosis not present

## 2019-02-02 DIAGNOSIS — N319 Neuromuscular dysfunction of bladder, unspecified: Secondary | ICD-10-CM | POA: Diagnosis not present

## 2019-02-02 DIAGNOSIS — C61 Malignant neoplasm of prostate: Secondary | ICD-10-CM | POA: Diagnosis not present

## 2019-04-03 DIAGNOSIS — K5909 Other constipation: Secondary | ICD-10-CM | POA: Diagnosis not present

## 2019-04-03 DIAGNOSIS — Z1331 Encounter for screening for depression: Secondary | ICD-10-CM | POA: Diagnosis not present

## 2019-04-03 DIAGNOSIS — E118 Type 2 diabetes mellitus with unspecified complications: Secondary | ICD-10-CM | POA: Diagnosis not present

## 2019-04-03 DIAGNOSIS — M25552 Pain in left hip: Secondary | ICD-10-CM | POA: Diagnosis not present

## 2019-04-03 DIAGNOSIS — K219 Gastro-esophageal reflux disease without esophagitis: Secondary | ICD-10-CM | POA: Diagnosis not present

## 2019-04-03 DIAGNOSIS — E1122 Type 2 diabetes mellitus with diabetic chronic kidney disease: Secondary | ICD-10-CM | POA: Diagnosis not present

## 2019-04-03 DIAGNOSIS — M159 Polyosteoarthritis, unspecified: Secondary | ICD-10-CM | POA: Diagnosis not present

## 2019-04-03 DIAGNOSIS — E7800 Pure hypercholesterolemia, unspecified: Secondary | ICD-10-CM | POA: Diagnosis not present

## 2019-04-03 DIAGNOSIS — R945 Abnormal results of liver function studies: Secondary | ICD-10-CM | POA: Diagnosis not present

## 2019-04-03 DIAGNOSIS — N529 Male erectile dysfunction, unspecified: Secondary | ICD-10-CM | POA: Diagnosis not present

## 2019-04-03 DIAGNOSIS — R972 Elevated prostate specific antigen [PSA]: Secondary | ICD-10-CM | POA: Diagnosis not present

## 2019-04-17 DIAGNOSIS — M549 Dorsalgia, unspecified: Secondary | ICD-10-CM | POA: Diagnosis not present

## 2019-04-17 DIAGNOSIS — K5909 Other constipation: Secondary | ICD-10-CM | POA: Diagnosis not present

## 2019-04-17 DIAGNOSIS — N529 Male erectile dysfunction, unspecified: Secondary | ICD-10-CM | POA: Diagnosis not present

## 2019-04-17 DIAGNOSIS — E1122 Type 2 diabetes mellitus with diabetic chronic kidney disease: Secondary | ICD-10-CM | POA: Diagnosis not present

## 2019-04-17 DIAGNOSIS — R945 Abnormal results of liver function studies: Secondary | ICD-10-CM | POA: Diagnosis not present

## 2019-04-17 DIAGNOSIS — K219 Gastro-esophageal reflux disease without esophagitis: Secondary | ICD-10-CM | POA: Diagnosis not present

## 2019-04-17 DIAGNOSIS — G609 Hereditary and idiopathic neuropathy, unspecified: Secondary | ICD-10-CM | POA: Diagnosis not present

## 2019-04-17 DIAGNOSIS — M159 Polyosteoarthritis, unspecified: Secondary | ICD-10-CM | POA: Diagnosis not present

## 2019-04-17 DIAGNOSIS — R972 Elevated prostate specific antigen [PSA]: Secondary | ICD-10-CM | POA: Diagnosis not present

## 2019-05-15 DIAGNOSIS — M549 Dorsalgia, unspecified: Secondary | ICD-10-CM | POA: Diagnosis not present

## 2019-05-15 DIAGNOSIS — N529 Male erectile dysfunction, unspecified: Secondary | ICD-10-CM | POA: Diagnosis not present

## 2019-05-15 DIAGNOSIS — K5909 Other constipation: Secondary | ICD-10-CM | POA: Diagnosis not present

## 2019-05-15 DIAGNOSIS — R945 Abnormal results of liver function studies: Secondary | ICD-10-CM | POA: Diagnosis not present

## 2019-05-15 DIAGNOSIS — G609 Hereditary and idiopathic neuropathy, unspecified: Secondary | ICD-10-CM | POA: Diagnosis not present

## 2019-05-15 DIAGNOSIS — M159 Polyosteoarthritis, unspecified: Secondary | ICD-10-CM | POA: Diagnosis not present

## 2019-05-15 DIAGNOSIS — R972 Elevated prostate specific antigen [PSA]: Secondary | ICD-10-CM | POA: Diagnosis not present

## 2019-05-15 DIAGNOSIS — I10 Essential (primary) hypertension: Secondary | ICD-10-CM | POA: Diagnosis not present

## 2019-05-15 DIAGNOSIS — E1122 Type 2 diabetes mellitus with diabetic chronic kidney disease: Secondary | ICD-10-CM | POA: Diagnosis not present

## 2019-05-15 DIAGNOSIS — K219 Gastro-esophageal reflux disease without esophagitis: Secondary | ICD-10-CM | POA: Diagnosis not present

## 2019-06-01 DIAGNOSIS — N529 Male erectile dysfunction, unspecified: Secondary | ICD-10-CM | POA: Diagnosis not present

## 2019-06-01 DIAGNOSIS — K5909 Other constipation: Secondary | ICD-10-CM | POA: Diagnosis not present

## 2019-06-01 DIAGNOSIS — E1122 Type 2 diabetes mellitus with diabetic chronic kidney disease: Secondary | ICD-10-CM | POA: Diagnosis not present

## 2019-06-01 DIAGNOSIS — R7989 Other specified abnormal findings of blood chemistry: Secondary | ICD-10-CM | POA: Diagnosis not present

## 2019-06-01 DIAGNOSIS — M159 Polyosteoarthritis, unspecified: Secondary | ICD-10-CM | POA: Diagnosis not present

## 2019-06-01 DIAGNOSIS — R972 Elevated prostate specific antigen [PSA]: Secondary | ICD-10-CM | POA: Diagnosis not present

## 2019-06-01 DIAGNOSIS — I10 Essential (primary) hypertension: Secondary | ICD-10-CM | POA: Diagnosis not present

## 2019-06-01 DIAGNOSIS — R945 Abnormal results of liver function studies: Secondary | ICD-10-CM | POA: Diagnosis not present

## 2019-06-01 DIAGNOSIS — K219 Gastro-esophageal reflux disease without esophagitis: Secondary | ICD-10-CM | POA: Diagnosis not present

## 2019-06-08 DIAGNOSIS — C61 Malignant neoplasm of prostate: Secondary | ICD-10-CM | POA: Diagnosis not present

## 2019-06-08 DIAGNOSIS — N319 Neuromuscular dysfunction of bladder, unspecified: Secondary | ICD-10-CM | POA: Diagnosis not present

## 2019-06-15 DIAGNOSIS — I10 Essential (primary) hypertension: Secondary | ICD-10-CM | POA: Diagnosis not present

## 2019-06-15 DIAGNOSIS — R972 Elevated prostate specific antigen [PSA]: Secondary | ICD-10-CM | POA: Diagnosis not present

## 2019-06-15 DIAGNOSIS — K219 Gastro-esophageal reflux disease without esophagitis: Secondary | ICD-10-CM | POA: Diagnosis not present

## 2019-06-15 DIAGNOSIS — K5909 Other constipation: Secondary | ICD-10-CM | POA: Diagnosis not present

## 2019-06-15 DIAGNOSIS — N183 Chronic kidney disease, stage 3 (moderate): Secondary | ICD-10-CM | POA: Diagnosis not present

## 2019-06-15 DIAGNOSIS — R945 Abnormal results of liver function studies: Secondary | ICD-10-CM | POA: Diagnosis not present

## 2019-06-15 DIAGNOSIS — R7989 Other specified abnormal findings of blood chemistry: Secondary | ICD-10-CM | POA: Diagnosis not present

## 2019-06-15 DIAGNOSIS — N529 Male erectile dysfunction, unspecified: Secondary | ICD-10-CM | POA: Diagnosis not present

## 2019-06-15 DIAGNOSIS — E1122 Type 2 diabetes mellitus with diabetic chronic kidney disease: Secondary | ICD-10-CM | POA: Diagnosis not present

## 2019-06-29 DIAGNOSIS — E1122 Type 2 diabetes mellitus with diabetic chronic kidney disease: Secondary | ICD-10-CM | POA: Diagnosis not present

## 2019-06-29 DIAGNOSIS — R7989 Other specified abnormal findings of blood chemistry: Secondary | ICD-10-CM | POA: Diagnosis not present

## 2019-06-29 DIAGNOSIS — I495 Sick sinus syndrome: Secondary | ICD-10-CM | POA: Diagnosis not present

## 2019-06-29 DIAGNOSIS — R972 Elevated prostate specific antigen [PSA]: Secondary | ICD-10-CM | POA: Diagnosis not present

## 2019-06-29 DIAGNOSIS — R945 Abnormal results of liver function studies: Secondary | ICD-10-CM | POA: Diagnosis not present

## 2019-06-29 DIAGNOSIS — K219 Gastro-esophageal reflux disease without esophagitis: Secondary | ICD-10-CM | POA: Diagnosis not present

## 2019-06-29 DIAGNOSIS — N529 Male erectile dysfunction, unspecified: Secondary | ICD-10-CM | POA: Diagnosis not present

## 2019-06-29 DIAGNOSIS — E118 Type 2 diabetes mellitus with unspecified complications: Secondary | ICD-10-CM | POA: Diagnosis not present

## 2019-06-29 DIAGNOSIS — K5909 Other constipation: Secondary | ICD-10-CM | POA: Diagnosis not present

## 2019-06-29 DIAGNOSIS — I10 Essential (primary) hypertension: Secondary | ICD-10-CM | POA: Diagnosis not present

## 2019-07-26 DIAGNOSIS — E785 Hyperlipidemia, unspecified: Secondary | ICD-10-CM | POA: Diagnosis not present

## 2019-07-26 DIAGNOSIS — Z9181 History of falling: Secondary | ICD-10-CM | POA: Diagnosis not present

## 2019-07-26 DIAGNOSIS — Z139 Encounter for screening, unspecified: Secondary | ICD-10-CM | POA: Diagnosis not present

## 2019-07-26 DIAGNOSIS — Z125 Encounter for screening for malignant neoplasm of prostate: Secondary | ICD-10-CM | POA: Diagnosis not present

## 2019-07-26 DIAGNOSIS — Z0000 Encounter for general adult medical examination without abnormal findings: Secondary | ICD-10-CM | POA: Diagnosis not present

## 2019-07-26 DIAGNOSIS — Z1331 Encounter for screening for depression: Secondary | ICD-10-CM | POA: Diagnosis not present

## 2019-07-31 DIAGNOSIS — R945 Abnormal results of liver function studies: Secondary | ICD-10-CM | POA: Diagnosis not present

## 2019-07-31 DIAGNOSIS — I10 Essential (primary) hypertension: Secondary | ICD-10-CM | POA: Diagnosis not present

## 2019-07-31 DIAGNOSIS — R7989 Other specified abnormal findings of blood chemistry: Secondary | ICD-10-CM | POA: Diagnosis not present

## 2019-07-31 DIAGNOSIS — Z23 Encounter for immunization: Secondary | ICD-10-CM | POA: Diagnosis not present

## 2019-07-31 DIAGNOSIS — E1122 Type 2 diabetes mellitus with diabetic chronic kidney disease: Secondary | ICD-10-CM | POA: Diagnosis not present

## 2019-07-31 DIAGNOSIS — K219 Gastro-esophageal reflux disease without esophagitis: Secondary | ICD-10-CM | POA: Diagnosis not present

## 2019-07-31 DIAGNOSIS — I495 Sick sinus syndrome: Secondary | ICD-10-CM | POA: Diagnosis not present

## 2019-07-31 DIAGNOSIS — N529 Male erectile dysfunction, unspecified: Secondary | ICD-10-CM | POA: Diagnosis not present

## 2019-07-31 DIAGNOSIS — E7800 Pure hypercholesterolemia, unspecified: Secondary | ICD-10-CM | POA: Diagnosis not present

## 2019-10-02 DIAGNOSIS — K219 Gastro-esophageal reflux disease without esophagitis: Secondary | ICD-10-CM | POA: Diagnosis not present

## 2019-10-02 DIAGNOSIS — M159 Polyosteoarthritis, unspecified: Secondary | ICD-10-CM | POA: Diagnosis not present

## 2019-10-02 DIAGNOSIS — R7989 Other specified abnormal findings of blood chemistry: Secondary | ICD-10-CM | POA: Diagnosis not present

## 2019-10-02 DIAGNOSIS — N529 Male erectile dysfunction, unspecified: Secondary | ICD-10-CM | POA: Diagnosis not present

## 2019-10-02 DIAGNOSIS — R972 Elevated prostate specific antigen [PSA]: Secondary | ICD-10-CM | POA: Diagnosis not present

## 2019-10-02 DIAGNOSIS — K5909 Other constipation: Secondary | ICD-10-CM | POA: Diagnosis not present

## 2019-10-02 DIAGNOSIS — I495 Sick sinus syndrome: Secondary | ICD-10-CM | POA: Diagnosis not present

## 2019-10-02 DIAGNOSIS — R945 Abnormal results of liver function studies: Secondary | ICD-10-CM | POA: Diagnosis not present

## 2019-10-02 DIAGNOSIS — E1122 Type 2 diabetes mellitus with diabetic chronic kidney disease: Secondary | ICD-10-CM | POA: Diagnosis not present

## 2019-10-02 DIAGNOSIS — E118 Type 2 diabetes mellitus with unspecified complications: Secondary | ICD-10-CM | POA: Diagnosis not present

## 2019-10-11 DIAGNOSIS — N319 Neuromuscular dysfunction of bladder, unspecified: Secondary | ICD-10-CM | POA: Diagnosis not present

## 2019-10-11 DIAGNOSIS — C61 Malignant neoplasm of prostate: Secondary | ICD-10-CM | POA: Diagnosis not present

## 2019-10-11 DIAGNOSIS — R339 Retention of urine, unspecified: Secondary | ICD-10-CM | POA: Diagnosis not present

## 2019-10-25 ENCOUNTER — Other Ambulatory Visit: Payer: Self-pay | Admitting: Urology

## 2019-10-25 DIAGNOSIS — C61 Malignant neoplasm of prostate: Secondary | ICD-10-CM

## 2019-10-26 ENCOUNTER — Ambulatory Visit
Admission: RE | Admit: 2019-10-26 | Discharge: 2019-10-26 | Disposition: A | Discharge status: Home / Self Care | Payer: Medicare HMO | Source: Ambulatory Visit | Attending: Urology | Admitting: Urology

## 2019-10-26 DIAGNOSIS — C61 Malignant neoplasm of prostate: Secondary | ICD-10-CM | POA: Diagnosis not present

## 2019-10-26 IMAGING — MR MR PROSTATE WO/W CM
56 series · 56 of 56 positions shown · IV contrast (19ml Multihance)
Comparison: [DATE] abdominopelvic CT.

CLINICAL DATA: Prostate cancer.  PSA of 4.3 in [REDACTED].

EXAM:
MR PROSTATE WITHOUT AND WITH CONTRAST
TECHNIQUE: Multiplanar multisequence MRI images were obtained of the pelvis
centered about the prostate. Pre and post contrast images were
obtained.
CONTRAST:  19mL MULTIHANCE GADOBENATE DIMEGLUMINE 529 MG/ML IV SOLN

[Series 3: bSSFP fat-sat · axial · 8.0mm · 0.74mm/px · 1 of 28 slices shown]
[im 1/28]
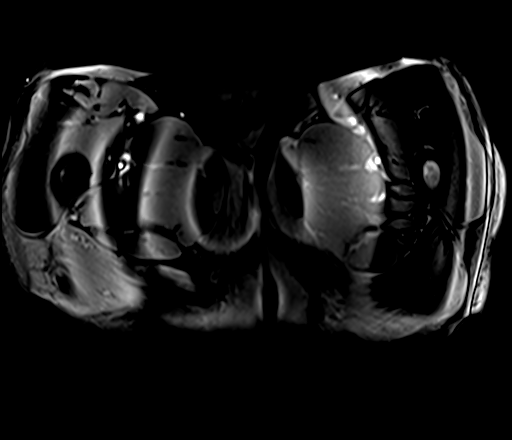

[Series 4: T2 · sagittal · 3.5mm · 0.56mm/px · 1 of 39 slices shown (1 of 5)]
[im 1/39]
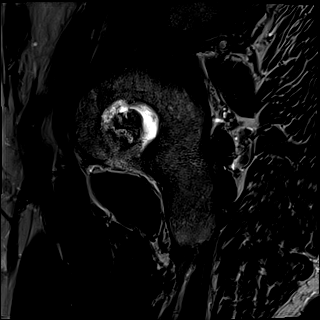

[Series 5: T1 · axial · 8.0mm · 1.06mm/px · 1 of 28 slices shown (1 of 2)]
[im 1/28]
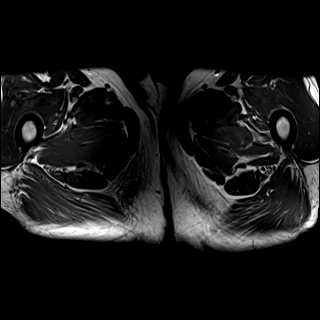

[Series 6: T2 · sagittal · 3.5mm · 0.56mm/px · 1 of 39 slices shown (2 of 5)]
[im 1/39]
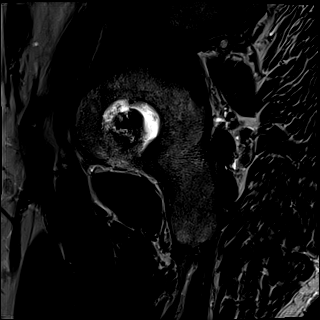

[Series 7: T2 · coronal · 3.5mm · 0.56mm/px · 1 of 23 slices shown (3 of 5)]
[im 1/23]
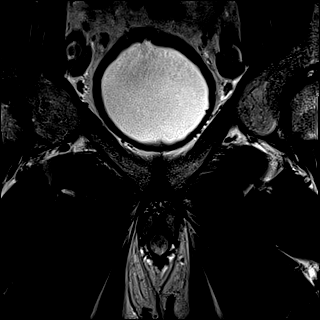

[Series 8: DWI · axial · 3.5mm · 1.56mm/px · 1 of 60 slices shown (1 of 2)]
[im 1/60]
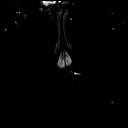

[Series 9: DWI · axial · 3.5mm · 1.56mm/px · 1 of 20 slices shown (2 of 2)]
[im 1/20]
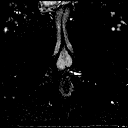

[Series 10: T1 · axial · 3.0mm · 0.31mm/px · 1 of 24 slices shown (2 of 2)]
[im 1/24]
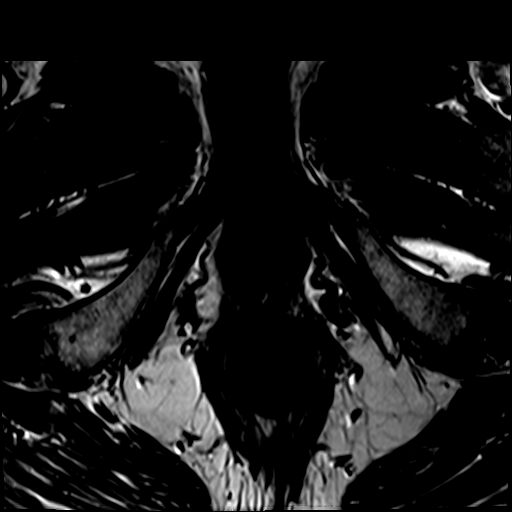

[Series 11: T2 · axial · 3.5mm · 0.56mm/px · 1 of 23 slices shown (4 of 5)]
[im 1/23]
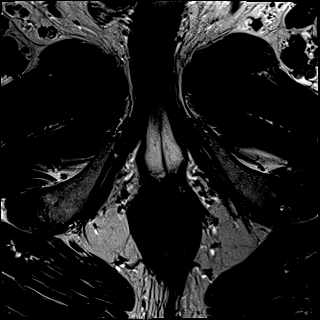

[Series 12: T2 · axial · 1.0mm · 1.04mm/px · 1 of 80 slices shown (5 of 5)]
[im 1/80]
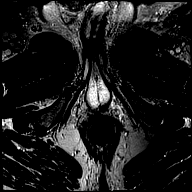

[Series 13: pre t1_twist_tra_dyn_ttc=5.3s · axial · non-contrast · 3.5mm · 0.83mm/px · 1 of 20 slices shown]
[im 1/20]
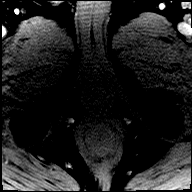

[Series 14: post t1_twist_tra_dyn-copy center · axial · 3.5mm · 0.83mm/px · 1 of 20 slices shown (1 of 23)]
[im 1/20]
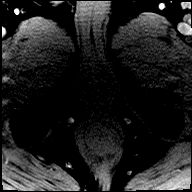

[Series 15: post t1_twist_tra_dyn-copy center · axial · 3.5mm · 0.83mm/px · 1 of 20 slices shown (2 of 23)]
[im 1/20]
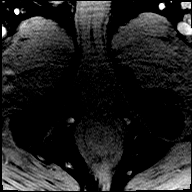

[Series 16: post t1_twist_tra_dyn-copy cent_sub_ttc=(id) · axial · 3.5mm · 0.83mm/px · 1 of 20 slices shown (1 of 22)]
[im 1/20]
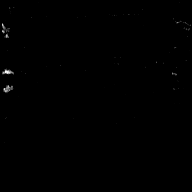

[Series 17: post t1_twist_tra_dyn-copy center · axial · 3.5mm · 0.83mm/px · 1 of 20 slices shown (3 of 23)]
[im 1/20]
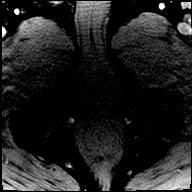

[Series 18: post t1_twist_tra_dyn-copy cent_sub_ttc=(id) · axial · 3.5mm · 0.83mm/px · 1 of 20 slices shown (2 of 22)]
[im 1/20]
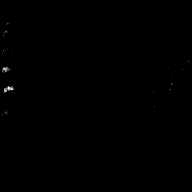

[Series 19: post t1_twist_tra_dyn-copy center · axial · 3.5mm · 0.83mm/px · 1 of 20 slices shown (4 of 23)]
[im 1/20]
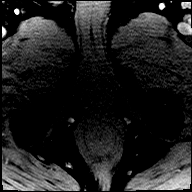

[Series 20: post t1_twist_tra_dyn-copy cent_sub_ttc=(id) · axial · 3.5mm · 0.83mm/px · 1 of 20 slices shown (3 of 22)]
[im 1/20]
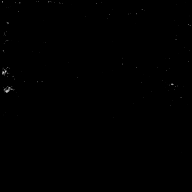

[Series 21: post t1_twist_tra_dyn-copy center · axial · 3.5mm · 0.83mm/px · 1 of 20 slices shown (5 of 23)]
[im 1/20]
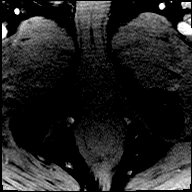

[Series 22: post t1_twist_tra_dyn-copy cent_sub_ttc=(id) · axial · 3.5mm · 0.83mm/px · 1 of 20 slices shown (4 of 22)]
[im 1/20]
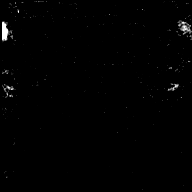

[Series 23: post t1_twist_tra_dyn-copy center · axial · 3.5mm · 0.83mm/px · 1 of 20 slices shown (6 of 23)]
[im 1/20]
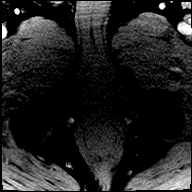

[Series 24: post t1_twist_tra_dyn-copy cent_sub_ttc=(id) · axial · 3.5mm · 0.83mm/px · 1 of 20 slices shown (5 of 22)]
[im 1/20]
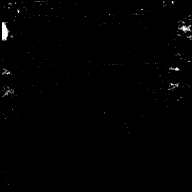

[Series 25: post t1_twist_tra_dyn-copy center · axial · 3.5mm · 0.83mm/px · 1 of 20 slices shown (7 of 23)]
[im 1/20]
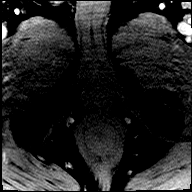

[Series 26: post t1_twist_tra_dyn-copy cent_sub_ttc=(id) · axial · 3.5mm · 0.83mm/px · 1 of 20 slices shown (6 of 22)]
[im 1/20]
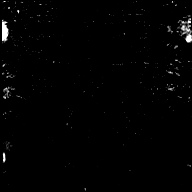

[Series 27: post t1_twist_tra_dyn-copy center · axial · 3.5mm · 0.83mm/px · 1 of 20 slices shown (8 of 23)]
[im 1/20]
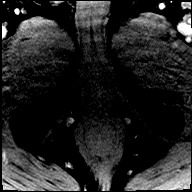

[Series 28: post t1_twist_tra_dyn-copy cent_sub_ttc=(id) · axial · 3.5mm · 0.83mm/px · 1 of 20 slices shown (7 of 22)]
[im 1/20]
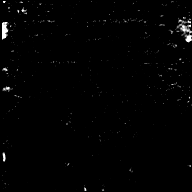

[Series 29: post t1_twist_tra_dyn-copy center · axial · 3.5mm · 0.83mm/px · 1 of 20 slices shown (9 of 23)]
[im 1/20]
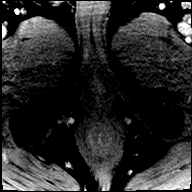

[Series 30: post t1_twist_tra_dyn-copy cent_sub_ttc=(id) · axial · 3.5mm · 0.83mm/px · 1 of 20 slices shown (8 of 22)]
[im 1/20]
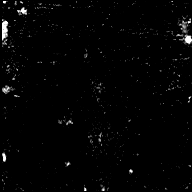

[Series 31: post t1_twist_tra_dyn-copy center · axial · 3.5mm · 0.83mm/px · 1 of 20 slices shown (10 of 23)]
[im 1/20]
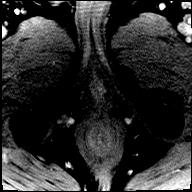

[Series 32: post t1_twist_tra_dyn-copy cent_sub_ttc=(id) · axial · 3.5mm · 0.83mm/px · 1 of 20 slices shown (9 of 22)]
[im 1/20]
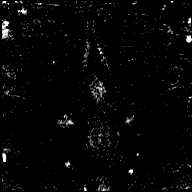

[Series 33: post t1_twist_tra_dyn-copy center · axial · 3.5mm · 0.83mm/px · 1 of 20 slices shown (11 of 23)]
[im 1/20]
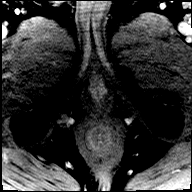

[Series 34: post t1_twist_tra_dyn-copy cent_sub_ttc=(id) · axial · 3.5mm · 0.83mm/px · 1 of 20 slices shown (10 of 22)]
[im 1/20]
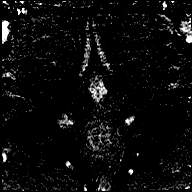

[Series 35: post t1_twist_tra_dyn-copy center · axial · 3.5mm · 0.83mm/px · 1 of 20 slices shown (12 of 23)]
[im 1/20]
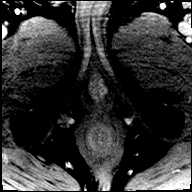

[Series 36: post t1_twist_tra_dyn-copy cent_sub_ttc=(id) · axial · 3.5mm · 0.83mm/px · 1 of 20 slices shown (11 of 22)]
[im 1/20]
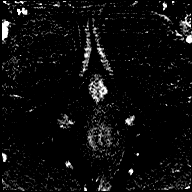

[Series 37: post t1_twist_tra_dyn-copy center · axial · 3.5mm · 0.83mm/px · 1 of 20 slices shown (13 of 23)]
[im 1/20]
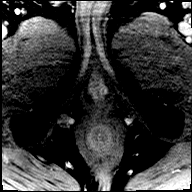

[Series 38: post t1_twist_tra_dyn-copy cent_sub_ttc=(id) · axial · 3.5mm · 0.83mm/px · 1 of 20 slices shown (12 of 22)]
[im 1/20]
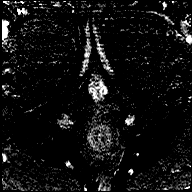

[Series 39: post t1_twist_tra_dyn-copy center · axial · 3.5mm · 0.83mm/px · 1 of 20 slices shown (14 of 23)]
[im 1/20]
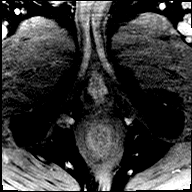

[Series 40: post t1_twist_tra_dyn-copy cent_sub_ttc=(id) · axial · 3.5mm · 0.83mm/px · 1 of 20 slices shown (13 of 22)]
[im 1/20]
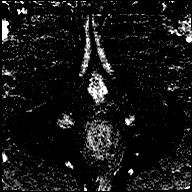

[Series 41: post t1_twist_tra_dyn-copy center · axial · 3.5mm · 0.83mm/px · 1 of 20 slices shown (15 of 23)]
[im 1/20]
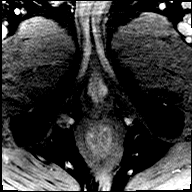

[Series 42: post t1_twist_tra_dyn-copy cent_sub_ttc=(id) · axial · 3.5mm · 0.83mm/px · 1 of 20 slices shown (14 of 22)]
[im 1/20]
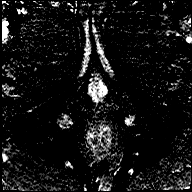

[Series 43: post t1_twist_tra_dyn-copy center · axial · 3.5mm · 0.83mm/px · 1 of 20 slices shown (16 of 23)]
[im 1/20]
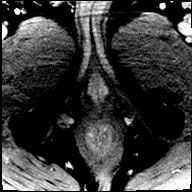

[Series 44: post t1_twist_tra_dyn-copy cent_sub_ttc=(id) · axial · 3.5mm · 0.83mm/px · 1 of 20 slices shown (15 of 22)]
[im 1/20]
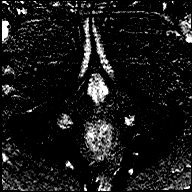

[Series 45: post t1_twist_tra_dyn-copy center · axial · 3.5mm · 0.83mm/px · 1 of 20 slices shown (17 of 23)]
[im 1/20]
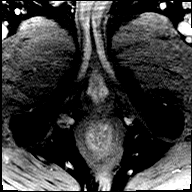

[Series 46: post t1_twist_tra_dyn-copy cent_sub_ttc=(id) · axial · 3.5mm · 0.83mm/px · 1 of 20 slices shown (16 of 22)]
[im 1/20]
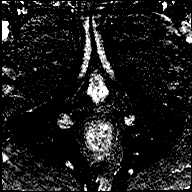

[Series 47: post t1_twist_tra_dyn-copy center · axial · 3.5mm · 0.83mm/px · 1 of 20 slices shown (18 of 23)]
[im 1/20]
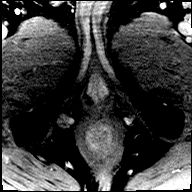

[Series 48: post t1_twist_tra_dyn-copy cent_sub_ttc=(id) · axial · 3.5mm · 0.83mm/px · 1 of 20 slices shown (17 of 22)]
[im 1/20]
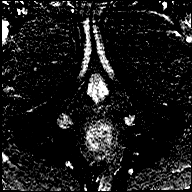

[Series 49: post t1_twist_tra_dyn-copy center · axial · 3.5mm · 0.83mm/px · 1 of 20 slices shown (19 of 23)]
[im 1/20]
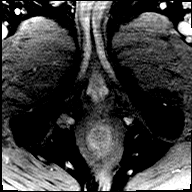

[Series 50: post t1_twist_tra_dyn-copy cent_sub_ttc=(id) · axial · 3.5mm · 0.83mm/px · 1 of 20 slices shown (18 of 22)]
[im 1/20]
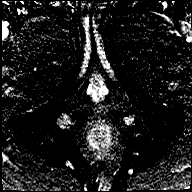

[Series 51: post t1_twist_tra_dyn-copy center · axial · 3.5mm · 0.83mm/px · 1 of 20 slices shown (20 of 23)]
[im 1/20]
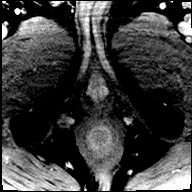

[Series 52: post t1_twist_tra_dyn-copy cent_sub_ttc=(id) · axial · 3.5mm · 0.83mm/px · 1 of 20 slices shown (19 of 22)]
[im 1/20]
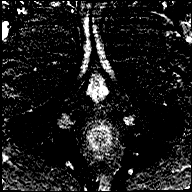

[Series 53: post t1_twist_tra_dyn-copy center · axial · 3.5mm · 0.83mm/px · 1 of 20 slices shown (21 of 23)]
[im 1/20]
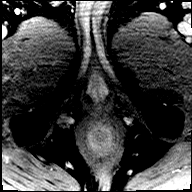

[Series 54: post t1_twist_tra_dyn-copy cent_sub_ttc=(id) · axial · 3.5mm · 0.83mm/px · 1 of 20 slices shown (20 of 22)]
[im 1/20]
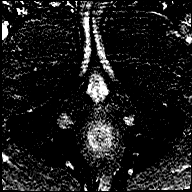

[Series 55: post t1_twist_tra_dyn-copy center · axial · 3.5mm · 0.83mm/px · 1 of 20 slices shown (22 of 23)]
[im 1/20]
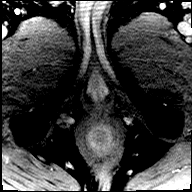

[Series 56: post t1_twist_tra_dyn-copy cent_sub_ttc=(id) · axial · 3.5mm · 0.83mm/px · 1 of 20 slices shown (21 of 22)]
[im 1/20]
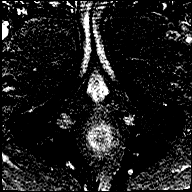

[Series 57: post t1_twist_tra_dyn-copy center · axial · 3.5mm · 0.83mm/px · 1 of 20 slices shown (23 of 23)]
[im 1/20]
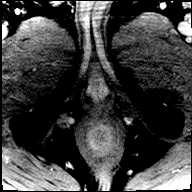

[Series 58: post t1_twist_tra_dyn-copy cent_sub_ttc=(id) · axial · 3.5mm · 0.83mm/px · 1 of 20 slices shown (22 of 22)]
[im 1/20]
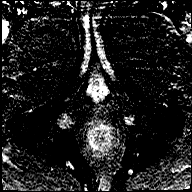

[56 of 56 positions shown; findings below may reference images not displayed]

Biopsy results of
[DATE]. This demonstrates Gleason 3+4=7 disease within the left
lateral apex.
FINDINGS: Prostate: Relatively mild for age benign prostatic hyperplasia. A
somewhat ill-defined T2 hypointense nodule within the anterior left
central gland, at the base, measures on the order of 1.8 cm
maximally on [DATE]. Likely extends superiorly on image [DATE]. Also
image [DATE]. Likely corresponds to restricted diffusion on images 8
and 9 of series 9. No masslike early post-contrast enhancement.

No suspicious findings within the peripheral zone.

Volume: 4.3 x 3.6 x 3.7 cm (volume = 30 cm^3)

Transcapsular spread:  Absent

Seminal vesicle involvement: Absent

Neurovascular bundle involvement: Absent

Pelvic adenopathy: Absent

Bone metastasis: Absent

Other findings: No significant free fluid. Normal urinary bladder.
Scattered colonic diverticula.
IMPRESSION: 1. Ill-defined anterior left central gland nodule at the base,
suspicious for macroscopic carcinoma. PI-RADS(v2.1)-5.
2. No findings of pelvic metastatic disease.

## 2019-10-26 MED ORDER — GADOBENATE DIMEGLUMINE 529 MG/ML IV SOLN
19.0000 mL | Freq: Once | INTRAVENOUS | Status: AC | PRN
  Administered 2019-10-26: 19 mL via INTRAVENOUS

## 2019-12-07 DIAGNOSIS — R972 Elevated prostate specific antigen [PSA]: Secondary | ICD-10-CM | POA: Diagnosis not present

## 2019-12-07 DIAGNOSIS — K5909 Other constipation: Secondary | ICD-10-CM | POA: Diagnosis not present

## 2019-12-07 DIAGNOSIS — M159 Polyosteoarthritis, unspecified: Secondary | ICD-10-CM | POA: Diagnosis not present

## 2019-12-07 DIAGNOSIS — R7989 Other specified abnormal findings of blood chemistry: Secondary | ICD-10-CM | POA: Diagnosis not present

## 2019-12-07 DIAGNOSIS — I10 Essential (primary) hypertension: Secondary | ICD-10-CM | POA: Diagnosis not present

## 2019-12-07 DIAGNOSIS — E7800 Pure hypercholesterolemia, unspecified: Secondary | ICD-10-CM | POA: Diagnosis not present

## 2019-12-07 DIAGNOSIS — R945 Abnormal results of liver function studies: Secondary | ICD-10-CM | POA: Diagnosis not present

## 2019-12-07 DIAGNOSIS — K219 Gastro-esophageal reflux disease without esophagitis: Secondary | ICD-10-CM | POA: Diagnosis not present

## 2019-12-07 DIAGNOSIS — N529 Male erectile dysfunction, unspecified: Secondary | ICD-10-CM | POA: Diagnosis not present

## 2019-12-07 DIAGNOSIS — E1122 Type 2 diabetes mellitus with diabetic chronic kidney disease: Secondary | ICD-10-CM | POA: Diagnosis not present

## 2019-12-07 DIAGNOSIS — I495 Sick sinus syndrome: Secondary | ICD-10-CM | POA: Diagnosis not present

## 2020-01-15 DIAGNOSIS — C61 Malignant neoplasm of prostate: Secondary | ICD-10-CM | POA: Diagnosis not present

## 2020-01-19 DIAGNOSIS — R339 Retention of urine, unspecified: Secondary | ICD-10-CM | POA: Diagnosis not present

## 2020-01-19 DIAGNOSIS — C61 Malignant neoplasm of prostate: Secondary | ICD-10-CM | POA: Diagnosis not present

## 2020-02-01 DIAGNOSIS — C61 Malignant neoplasm of prostate: Secondary | ICD-10-CM | POA: Diagnosis not present

## 2020-02-08 DIAGNOSIS — R7989 Other specified abnormal findings of blood chemistry: Secondary | ICD-10-CM | POA: Diagnosis not present

## 2020-02-08 DIAGNOSIS — I10 Essential (primary) hypertension: Secondary | ICD-10-CM | POA: Diagnosis not present

## 2020-02-08 DIAGNOSIS — N529 Male erectile dysfunction, unspecified: Secondary | ICD-10-CM | POA: Diagnosis not present

## 2020-02-08 DIAGNOSIS — E1122 Type 2 diabetes mellitus with diabetic chronic kidney disease: Secondary | ICD-10-CM | POA: Diagnosis not present

## 2020-02-08 DIAGNOSIS — K219 Gastro-esophageal reflux disease without esophagitis: Secondary | ICD-10-CM | POA: Diagnosis not present

## 2020-02-08 DIAGNOSIS — I495 Sick sinus syndrome: Secondary | ICD-10-CM | POA: Diagnosis not present

## 2020-02-08 DIAGNOSIS — E118 Type 2 diabetes mellitus with unspecified complications: Secondary | ICD-10-CM | POA: Diagnosis not present

## 2020-02-08 DIAGNOSIS — E7800 Pure hypercholesterolemia, unspecified: Secondary | ICD-10-CM | POA: Diagnosis not present

## 2020-02-08 DIAGNOSIS — K5909 Other constipation: Secondary | ICD-10-CM | POA: Diagnosis not present

## 2020-02-08 DIAGNOSIS — R945 Abnormal results of liver function studies: Secondary | ICD-10-CM | POA: Diagnosis not present

## 2020-02-27 DIAGNOSIS — C61 Malignant neoplasm of prostate: Secondary | ICD-10-CM | POA: Diagnosis not present

## 2020-02-27 DIAGNOSIS — R339 Retention of urine, unspecified: Secondary | ICD-10-CM | POA: Diagnosis not present

## 2020-03-19 DIAGNOSIS — R945 Abnormal results of liver function studies: Secondary | ICD-10-CM | POA: Diagnosis not present

## 2020-03-19 DIAGNOSIS — K5909 Other constipation: Secondary | ICD-10-CM | POA: Diagnosis not present

## 2020-03-19 DIAGNOSIS — E7800 Pure hypercholesterolemia, unspecified: Secondary | ICD-10-CM | POA: Diagnosis not present

## 2020-03-19 DIAGNOSIS — R7989 Other specified abnormal findings of blood chemistry: Secondary | ICD-10-CM | POA: Diagnosis not present

## 2020-03-19 DIAGNOSIS — M25551 Pain in right hip: Secondary | ICD-10-CM | POA: Diagnosis not present

## 2020-03-19 DIAGNOSIS — I495 Sick sinus syndrome: Secondary | ICD-10-CM | POA: Diagnosis not present

## 2020-03-19 DIAGNOSIS — E1122 Type 2 diabetes mellitus with diabetic chronic kidney disease: Secondary | ICD-10-CM | POA: Diagnosis not present

## 2020-03-19 DIAGNOSIS — K219 Gastro-esophageal reflux disease without esophagitis: Secondary | ICD-10-CM | POA: Diagnosis not present

## 2020-03-19 DIAGNOSIS — N529 Male erectile dysfunction, unspecified: Secondary | ICD-10-CM | POA: Diagnosis not present

## 2020-04-01 DIAGNOSIS — I10 Essential (primary) hypertension: Secondary | ICD-10-CM | POA: Diagnosis not present

## 2020-04-01 DIAGNOSIS — E1122 Type 2 diabetes mellitus with diabetic chronic kidney disease: Secondary | ICD-10-CM | POA: Diagnosis not present

## 2020-04-01 DIAGNOSIS — N529 Male erectile dysfunction, unspecified: Secondary | ICD-10-CM | POA: Diagnosis not present

## 2020-04-01 DIAGNOSIS — E7800 Pure hypercholesterolemia, unspecified: Secondary | ICD-10-CM | POA: Diagnosis not present

## 2020-04-01 DIAGNOSIS — R945 Abnormal results of liver function studies: Secondary | ICD-10-CM | POA: Diagnosis not present

## 2020-04-01 DIAGNOSIS — R7989 Other specified abnormal findings of blood chemistry: Secondary | ICD-10-CM | POA: Diagnosis not present

## 2020-04-01 DIAGNOSIS — Z6830 Body mass index (BMI) 30.0-30.9, adult: Secondary | ICD-10-CM | POA: Diagnosis not present

## 2020-04-01 DIAGNOSIS — I495 Sick sinus syndrome: Secondary | ICD-10-CM | POA: Diagnosis not present

## 2020-04-01 DIAGNOSIS — K219 Gastro-esophageal reflux disease without esophagitis: Secondary | ICD-10-CM | POA: Diagnosis not present

## 2020-04-11 DIAGNOSIS — R7989 Other specified abnormal findings of blood chemistry: Secondary | ICD-10-CM | POA: Diagnosis not present

## 2020-04-11 DIAGNOSIS — E7800 Pure hypercholesterolemia, unspecified: Secondary | ICD-10-CM | POA: Diagnosis not present

## 2020-04-11 DIAGNOSIS — K219 Gastro-esophageal reflux disease without esophagitis: Secondary | ICD-10-CM | POA: Diagnosis not present

## 2020-04-11 DIAGNOSIS — I495 Sick sinus syndrome: Secondary | ICD-10-CM | POA: Diagnosis not present

## 2020-04-11 DIAGNOSIS — N529 Male erectile dysfunction, unspecified: Secondary | ICD-10-CM | POA: Diagnosis not present

## 2020-04-11 DIAGNOSIS — Z6829 Body mass index (BMI) 29.0-29.9, adult: Secondary | ICD-10-CM | POA: Diagnosis not present

## 2020-04-11 DIAGNOSIS — R972 Elevated prostate specific antigen [PSA]: Secondary | ICD-10-CM | POA: Diagnosis not present

## 2020-04-11 DIAGNOSIS — I10 Essential (primary) hypertension: Secondary | ICD-10-CM | POA: Diagnosis not present

## 2020-06-13 DIAGNOSIS — N183 Chronic kidney disease, stage 3 unspecified: Secondary | ICD-10-CM | POA: Diagnosis not present

## 2020-06-13 DIAGNOSIS — E1122 Type 2 diabetes mellitus with diabetic chronic kidney disease: Secondary | ICD-10-CM | POA: Diagnosis not present

## 2020-06-13 DIAGNOSIS — K5909 Other constipation: Secondary | ICD-10-CM | POA: Diagnosis not present

## 2020-06-13 DIAGNOSIS — I495 Sick sinus syndrome: Secondary | ICD-10-CM | POA: Diagnosis not present

## 2020-06-13 DIAGNOSIS — N529 Male erectile dysfunction, unspecified: Secondary | ICD-10-CM | POA: Diagnosis not present

## 2020-06-13 DIAGNOSIS — R945 Abnormal results of liver function studies: Secondary | ICD-10-CM | POA: Diagnosis not present

## 2020-06-13 DIAGNOSIS — J309 Allergic rhinitis, unspecified: Secondary | ICD-10-CM | POA: Diagnosis not present

## 2020-06-13 DIAGNOSIS — R7989 Other specified abnormal findings of blood chemistry: Secondary | ICD-10-CM | POA: Diagnosis not present

## 2020-06-13 DIAGNOSIS — K219 Gastro-esophageal reflux disease without esophagitis: Secondary | ICD-10-CM | POA: Diagnosis not present

## 2020-07-01 DIAGNOSIS — C61 Malignant neoplasm of prostate: Secondary | ICD-10-CM | POA: Diagnosis not present

## 2020-07-01 DIAGNOSIS — R339 Retention of urine, unspecified: Secondary | ICD-10-CM | POA: Diagnosis not present

## 2020-07-24 DIAGNOSIS — E119 Type 2 diabetes mellitus without complications: Secondary | ICD-10-CM | POA: Diagnosis not present

## 2020-09-05 DIAGNOSIS — M659 Synovitis and tenosynovitis, unspecified: Secondary | ICD-10-CM | POA: Diagnosis not present

## 2020-09-05 DIAGNOSIS — R945 Abnormal results of liver function studies: Secondary | ICD-10-CM | POA: Diagnosis not present

## 2020-09-05 DIAGNOSIS — K219 Gastro-esophageal reflux disease without esophagitis: Secondary | ICD-10-CM | POA: Diagnosis not present

## 2020-09-05 DIAGNOSIS — Z23 Encounter for immunization: Secondary | ICD-10-CM | POA: Diagnosis not present

## 2020-09-05 DIAGNOSIS — E669 Obesity, unspecified: Secondary | ICD-10-CM | POA: Diagnosis not present

## 2020-09-05 DIAGNOSIS — R7989 Other specified abnormal findings of blood chemistry: Secondary | ICD-10-CM | POA: Diagnosis not present

## 2020-09-05 DIAGNOSIS — I495 Sick sinus syndrome: Secondary | ICD-10-CM | POA: Diagnosis not present

## 2020-09-05 DIAGNOSIS — E1122 Type 2 diabetes mellitus with diabetic chronic kidney disease: Secondary | ICD-10-CM | POA: Diagnosis not present

## 2020-09-05 DIAGNOSIS — N529 Male erectile dysfunction, unspecified: Secondary | ICD-10-CM | POA: Diagnosis not present

## 2020-09-12 DIAGNOSIS — Z1331 Encounter for screening for depression: Secondary | ICD-10-CM | POA: Diagnosis not present

## 2020-09-12 DIAGNOSIS — N529 Male erectile dysfunction, unspecified: Secondary | ICD-10-CM | POA: Diagnosis not present

## 2020-09-12 DIAGNOSIS — M659 Synovitis and tenosynovitis, unspecified: Secondary | ICD-10-CM | POA: Diagnosis not present

## 2020-09-12 DIAGNOSIS — E669 Obesity, unspecified: Secondary | ICD-10-CM | POA: Diagnosis not present

## 2020-09-12 DIAGNOSIS — Z139 Encounter for screening, unspecified: Secondary | ICD-10-CM | POA: Diagnosis not present

## 2020-09-12 DIAGNOSIS — K219 Gastro-esophageal reflux disease without esophagitis: Secondary | ICD-10-CM | POA: Diagnosis not present

## 2020-09-12 DIAGNOSIS — E118 Type 2 diabetes mellitus with unspecified complications: Secondary | ICD-10-CM | POA: Diagnosis not present

## 2020-09-12 DIAGNOSIS — E7800 Pure hypercholesterolemia, unspecified: Secondary | ICD-10-CM | POA: Diagnosis not present

## 2020-09-12 DIAGNOSIS — Z9181 History of falling: Secondary | ICD-10-CM | POA: Diagnosis not present

## 2020-09-12 DIAGNOSIS — I10 Essential (primary) hypertension: Secondary | ICD-10-CM | POA: Diagnosis not present

## 2020-10-03 DIAGNOSIS — R7989 Other specified abnormal findings of blood chemistry: Secondary | ICD-10-CM | POA: Diagnosis not present

## 2020-10-03 DIAGNOSIS — R945 Abnormal results of liver function studies: Secondary | ICD-10-CM | POA: Diagnosis not present

## 2020-10-03 DIAGNOSIS — E1122 Type 2 diabetes mellitus with diabetic chronic kidney disease: Secondary | ICD-10-CM | POA: Diagnosis not present

## 2020-10-03 DIAGNOSIS — N529 Male erectile dysfunction, unspecified: Secondary | ICD-10-CM | POA: Diagnosis not present

## 2020-10-03 DIAGNOSIS — E669 Obesity, unspecified: Secondary | ICD-10-CM | POA: Diagnosis not present

## 2020-10-03 DIAGNOSIS — K5909 Other constipation: Secondary | ICD-10-CM | POA: Diagnosis not present

## 2020-10-03 DIAGNOSIS — I495 Sick sinus syndrome: Secondary | ICD-10-CM | POA: Diagnosis not present

## 2020-10-03 DIAGNOSIS — K219 Gastro-esophageal reflux disease without esophagitis: Secondary | ICD-10-CM | POA: Diagnosis not present

## 2020-10-03 DIAGNOSIS — N183 Chronic kidney disease, stage 3 unspecified: Secondary | ICD-10-CM | POA: Diagnosis not present

## 2020-10-18 DIAGNOSIS — R7989 Other specified abnormal findings of blood chemistry: Secondary | ICD-10-CM | POA: Diagnosis not present

## 2020-10-18 DIAGNOSIS — K219 Gastro-esophageal reflux disease without esophagitis: Secondary | ICD-10-CM | POA: Diagnosis not present

## 2020-10-18 DIAGNOSIS — I495 Sick sinus syndrome: Secondary | ICD-10-CM | POA: Diagnosis not present

## 2020-10-18 DIAGNOSIS — R945 Abnormal results of liver function studies: Secondary | ICD-10-CM | POA: Diagnosis not present

## 2020-10-18 DIAGNOSIS — M25562 Pain in left knee: Secondary | ICD-10-CM | POA: Diagnosis not present

## 2020-10-18 DIAGNOSIS — N183 Chronic kidney disease, stage 3 unspecified: Secondary | ICD-10-CM | POA: Diagnosis not present

## 2020-10-18 DIAGNOSIS — N529 Male erectile dysfunction, unspecified: Secondary | ICD-10-CM | POA: Diagnosis not present

## 2020-10-18 DIAGNOSIS — E1122 Type 2 diabetes mellitus with diabetic chronic kidney disease: Secondary | ICD-10-CM | POA: Diagnosis not present

## 2020-10-18 DIAGNOSIS — E669 Obesity, unspecified: Secondary | ICD-10-CM | POA: Diagnosis not present

## 2020-10-24 DIAGNOSIS — R945 Abnormal results of liver function studies: Secondary | ICD-10-CM | POA: Diagnosis not present

## 2020-10-24 DIAGNOSIS — E1122 Type 2 diabetes mellitus with diabetic chronic kidney disease: Secondary | ICD-10-CM | POA: Diagnosis not present

## 2020-10-24 DIAGNOSIS — N529 Male erectile dysfunction, unspecified: Secondary | ICD-10-CM | POA: Diagnosis not present

## 2020-10-24 DIAGNOSIS — E669 Obesity, unspecified: Secondary | ICD-10-CM | POA: Diagnosis not present

## 2020-10-24 DIAGNOSIS — I495 Sick sinus syndrome: Secondary | ICD-10-CM | POA: Diagnosis not present

## 2020-10-24 DIAGNOSIS — K219 Gastro-esophageal reflux disease without esophagitis: Secondary | ICD-10-CM | POA: Diagnosis not present

## 2020-10-24 DIAGNOSIS — M659 Synovitis and tenosynovitis, unspecified: Secondary | ICD-10-CM | POA: Diagnosis not present

## 2020-10-24 DIAGNOSIS — R7989 Other specified abnormal findings of blood chemistry: Secondary | ICD-10-CM | POA: Diagnosis not present

## 2020-10-24 DIAGNOSIS — N183 Chronic kidney disease, stage 3 unspecified: Secondary | ICD-10-CM | POA: Diagnosis not present

## 2020-10-29 DIAGNOSIS — R945 Abnormal results of liver function studies: Secondary | ICD-10-CM | POA: Diagnosis not present

## 2020-10-29 DIAGNOSIS — R7989 Other specified abnormal findings of blood chemistry: Secondary | ICD-10-CM | POA: Diagnosis not present

## 2020-10-29 DIAGNOSIS — I495 Sick sinus syndrome: Secondary | ICD-10-CM | POA: Diagnosis not present

## 2020-10-29 DIAGNOSIS — E1122 Type 2 diabetes mellitus with diabetic chronic kidney disease: Secondary | ICD-10-CM | POA: Diagnosis not present

## 2020-10-29 DIAGNOSIS — M25562 Pain in left knee: Secondary | ICD-10-CM | POA: Diagnosis not present

## 2020-10-29 DIAGNOSIS — K219 Gastro-esophageal reflux disease without esophagitis: Secondary | ICD-10-CM | POA: Diagnosis not present

## 2020-10-29 DIAGNOSIS — N529 Male erectile dysfunction, unspecified: Secondary | ICD-10-CM | POA: Diagnosis not present

## 2020-10-29 DIAGNOSIS — N183 Chronic kidney disease, stage 3 unspecified: Secondary | ICD-10-CM | POA: Diagnosis not present

## 2020-10-29 DIAGNOSIS — E669 Obesity, unspecified: Secondary | ICD-10-CM | POA: Diagnosis not present

## 2020-11-05 DIAGNOSIS — R339 Retention of urine, unspecified: Secondary | ICD-10-CM | POA: Diagnosis not present

## 2020-11-05 DIAGNOSIS — C61 Malignant neoplasm of prostate: Secondary | ICD-10-CM | POA: Diagnosis not present

## 2020-11-05 DIAGNOSIS — Z79899 Other long term (current) drug therapy: Secondary | ICD-10-CM | POA: Diagnosis not present

## 2020-11-27 DIAGNOSIS — Z9181 History of falling: Secondary | ICD-10-CM | POA: Diagnosis not present

## 2020-11-27 DIAGNOSIS — Z0000 Encounter for general adult medical examination without abnormal findings: Secondary | ICD-10-CM | POA: Diagnosis not present

## 2020-11-27 DIAGNOSIS — Z1331 Encounter for screening for depression: Secondary | ICD-10-CM | POA: Diagnosis not present

## 2020-11-27 DIAGNOSIS — E785 Hyperlipidemia, unspecified: Secondary | ICD-10-CM | POA: Diagnosis not present

## 2020-12-16 DIAGNOSIS — R6889 Other general symptoms and signs: Secondary | ICD-10-CM | POA: Diagnosis not present

## 2020-12-16 DIAGNOSIS — Z20822 Contact with and (suspected) exposure to covid-19: Secondary | ICD-10-CM | POA: Diagnosis not present

## 2020-12-16 DIAGNOSIS — I10 Essential (primary) hypertension: Secondary | ICD-10-CM | POA: Diagnosis not present

## 2020-12-16 DIAGNOSIS — E118 Type 2 diabetes mellitus with unspecified complications: Secondary | ICD-10-CM | POA: Diagnosis not present

## 2020-12-16 DIAGNOSIS — Z6829 Body mass index (BMI) 29.0-29.9, adult: Secondary | ICD-10-CM | POA: Diagnosis not present

## 2020-12-18 DIAGNOSIS — Z6829 Body mass index (BMI) 29.0-29.9, adult: Secondary | ICD-10-CM | POA: Diagnosis not present

## 2020-12-18 DIAGNOSIS — U071 COVID-19: Secondary | ICD-10-CM | POA: Diagnosis not present

## 2020-12-18 DIAGNOSIS — R6889 Other general symptoms and signs: Secondary | ICD-10-CM | POA: Diagnosis not present

## 2020-12-18 DIAGNOSIS — E118 Type 2 diabetes mellitus with unspecified complications: Secondary | ICD-10-CM | POA: Diagnosis not present

## 2020-12-18 DIAGNOSIS — I10 Essential (primary) hypertension: Secondary | ICD-10-CM | POA: Diagnosis not present

## 2020-12-24 DIAGNOSIS — Z6829 Body mass index (BMI) 29.0-29.9, adult: Secondary | ICD-10-CM | POA: Diagnosis not present

## 2020-12-24 DIAGNOSIS — U071 COVID-19: Secondary | ICD-10-CM | POA: Diagnosis not present

## 2020-12-24 DIAGNOSIS — E118 Type 2 diabetes mellitus with unspecified complications: Secondary | ICD-10-CM | POA: Diagnosis not present

## 2020-12-24 DIAGNOSIS — R0600 Dyspnea, unspecified: Secondary | ICD-10-CM | POA: Diagnosis not present

## 2020-12-24 DIAGNOSIS — I10 Essential (primary) hypertension: Secondary | ICD-10-CM | POA: Diagnosis not present

## 2020-12-31 DIAGNOSIS — K219 Gastro-esophageal reflux disease without esophagitis: Secondary | ICD-10-CM | POA: Diagnosis not present

## 2020-12-31 DIAGNOSIS — R7989 Other specified abnormal findings of blood chemistry: Secondary | ICD-10-CM | POA: Diagnosis not present

## 2020-12-31 DIAGNOSIS — E1122 Type 2 diabetes mellitus with diabetic chronic kidney disease: Secondary | ICD-10-CM | POA: Diagnosis not present

## 2020-12-31 DIAGNOSIS — I495 Sick sinus syndrome: Secondary | ICD-10-CM | POA: Diagnosis not present

## 2020-12-31 DIAGNOSIS — R945 Abnormal results of liver function studies: Secondary | ICD-10-CM | POA: Diagnosis not present

## 2020-12-31 DIAGNOSIS — E669 Obesity, unspecified: Secondary | ICD-10-CM | POA: Diagnosis not present

## 2020-12-31 DIAGNOSIS — E118 Type 2 diabetes mellitus with unspecified complications: Secondary | ICD-10-CM | POA: Diagnosis not present

## 2020-12-31 DIAGNOSIS — R0600 Dyspnea, unspecified: Secondary | ICD-10-CM | POA: Diagnosis not present

## 2020-12-31 DIAGNOSIS — R0602 Shortness of breath: Secondary | ICD-10-CM | POA: Diagnosis not present

## 2020-12-31 DIAGNOSIS — N529 Male erectile dysfunction, unspecified: Secondary | ICD-10-CM | POA: Diagnosis not present

## 2021-01-09 DIAGNOSIS — N529 Male erectile dysfunction, unspecified: Secondary | ICD-10-CM | POA: Diagnosis not present

## 2021-01-09 DIAGNOSIS — R945 Abnormal results of liver function studies: Secondary | ICD-10-CM | POA: Diagnosis not present

## 2021-01-09 DIAGNOSIS — I10 Essential (primary) hypertension: Secondary | ICD-10-CM | POA: Diagnosis not present

## 2021-01-09 DIAGNOSIS — E7800 Pure hypercholesterolemia, unspecified: Secondary | ICD-10-CM | POA: Diagnosis not present

## 2021-01-09 DIAGNOSIS — R7989 Other specified abnormal findings of blood chemistry: Secondary | ICD-10-CM | POA: Diagnosis not present

## 2021-01-09 DIAGNOSIS — K219 Gastro-esophageal reflux disease without esophagitis: Secondary | ICD-10-CM | POA: Diagnosis not present

## 2021-01-09 DIAGNOSIS — R0600 Dyspnea, unspecified: Secondary | ICD-10-CM | POA: Diagnosis not present

## 2021-01-09 DIAGNOSIS — I495 Sick sinus syndrome: Secondary | ICD-10-CM | POA: Diagnosis not present

## 2021-01-09 DIAGNOSIS — N183 Chronic kidney disease, stage 3 unspecified: Secondary | ICD-10-CM | POA: Diagnosis not present

## 2021-01-09 DIAGNOSIS — E1122 Type 2 diabetes mellitus with diabetic chronic kidney disease: Secondary | ICD-10-CM | POA: Diagnosis not present

## 2021-01-09 DIAGNOSIS — E118 Type 2 diabetes mellitus with unspecified complications: Secondary | ICD-10-CM | POA: Diagnosis not present

## 2021-01-24 DIAGNOSIS — I495 Sick sinus syndrome: Secondary | ICD-10-CM | POA: Diagnosis not present

## 2021-01-24 DIAGNOSIS — K5909 Other constipation: Secondary | ICD-10-CM | POA: Diagnosis not present

## 2021-01-24 DIAGNOSIS — E1122 Type 2 diabetes mellitus with diabetic chronic kidney disease: Secondary | ICD-10-CM | POA: Diagnosis not present

## 2021-01-24 DIAGNOSIS — N529 Male erectile dysfunction, unspecified: Secondary | ICD-10-CM | POA: Diagnosis not present

## 2021-01-24 DIAGNOSIS — R945 Abnormal results of liver function studies: Secondary | ICD-10-CM | POA: Diagnosis not present

## 2021-01-24 DIAGNOSIS — R0600 Dyspnea, unspecified: Secondary | ICD-10-CM | POA: Diagnosis not present

## 2021-01-24 DIAGNOSIS — N183 Chronic kidney disease, stage 3 unspecified: Secondary | ICD-10-CM | POA: Diagnosis not present

## 2021-01-24 DIAGNOSIS — M159 Polyosteoarthritis, unspecified: Secondary | ICD-10-CM | POA: Diagnosis not present

## 2021-01-24 DIAGNOSIS — R7989 Other specified abnormal findings of blood chemistry: Secondary | ICD-10-CM | POA: Diagnosis not present

## 2021-01-24 DIAGNOSIS — K219 Gastro-esophageal reflux disease without esophagitis: Secondary | ICD-10-CM | POA: Diagnosis not present

## 2021-02-07 DIAGNOSIS — I495 Sick sinus syndrome: Secondary | ICD-10-CM | POA: Diagnosis not present

## 2021-02-07 DIAGNOSIS — K219 Gastro-esophageal reflux disease without esophagitis: Secondary | ICD-10-CM | POA: Diagnosis not present

## 2021-02-07 DIAGNOSIS — R7989 Other specified abnormal findings of blood chemistry: Secondary | ICD-10-CM | POA: Diagnosis not present

## 2021-02-07 DIAGNOSIS — R0600 Dyspnea, unspecified: Secondary | ICD-10-CM | POA: Diagnosis not present

## 2021-02-07 DIAGNOSIS — K5909 Other constipation: Secondary | ICD-10-CM | POA: Diagnosis not present

## 2021-02-07 DIAGNOSIS — E1122 Type 2 diabetes mellitus with diabetic chronic kidney disease: Secondary | ICD-10-CM | POA: Diagnosis not present

## 2021-02-07 DIAGNOSIS — R945 Abnormal results of liver function studies: Secondary | ICD-10-CM | POA: Diagnosis not present

## 2021-02-07 DIAGNOSIS — N529 Male erectile dysfunction, unspecified: Secondary | ICD-10-CM | POA: Diagnosis not present

## 2021-02-07 DIAGNOSIS — M159 Polyosteoarthritis, unspecified: Secondary | ICD-10-CM | POA: Diagnosis not present

## 2021-02-07 DIAGNOSIS — N183 Chronic kidney disease, stage 3 unspecified: Secondary | ICD-10-CM | POA: Diagnosis not present

## 2021-03-07 DIAGNOSIS — R945 Abnormal results of liver function studies: Secondary | ICD-10-CM | POA: Diagnosis not present

## 2021-03-07 DIAGNOSIS — N529 Male erectile dysfunction, unspecified: Secondary | ICD-10-CM | POA: Diagnosis not present

## 2021-03-07 DIAGNOSIS — K219 Gastro-esophageal reflux disease without esophagitis: Secondary | ICD-10-CM | POA: Diagnosis not present

## 2021-03-07 DIAGNOSIS — E7800 Pure hypercholesterolemia, unspecified: Secondary | ICD-10-CM | POA: Diagnosis not present

## 2021-03-07 DIAGNOSIS — R0600 Dyspnea, unspecified: Secondary | ICD-10-CM | POA: Diagnosis not present

## 2021-03-07 DIAGNOSIS — I10 Essential (primary) hypertension: Secondary | ICD-10-CM | POA: Diagnosis not present

## 2021-03-07 DIAGNOSIS — Z6829 Body mass index (BMI) 29.0-29.9, adult: Secondary | ICD-10-CM | POA: Diagnosis not present

## 2021-03-07 DIAGNOSIS — I495 Sick sinus syndrome: Secondary | ICD-10-CM | POA: Diagnosis not present

## 2021-03-07 DIAGNOSIS — R7989 Other specified abnormal findings of blood chemistry: Secondary | ICD-10-CM | POA: Diagnosis not present

## 2021-04-04 DIAGNOSIS — R7989 Other specified abnormal findings of blood chemistry: Secondary | ICD-10-CM | POA: Diagnosis not present

## 2021-04-04 DIAGNOSIS — I10 Essential (primary) hypertension: Secondary | ICD-10-CM | POA: Diagnosis not present

## 2021-04-04 DIAGNOSIS — K219 Gastro-esophageal reflux disease without esophagitis: Secondary | ICD-10-CM | POA: Diagnosis not present

## 2021-04-04 DIAGNOSIS — I495 Sick sinus syndrome: Secondary | ICD-10-CM | POA: Diagnosis not present

## 2021-04-04 DIAGNOSIS — E7800 Pure hypercholesterolemia, unspecified: Secondary | ICD-10-CM | POA: Diagnosis not present

## 2021-04-04 DIAGNOSIS — E1122 Type 2 diabetes mellitus with diabetic chronic kidney disease: Secondary | ICD-10-CM | POA: Diagnosis not present

## 2021-04-04 DIAGNOSIS — N529 Male erectile dysfunction, unspecified: Secondary | ICD-10-CM | POA: Diagnosis not present

## 2021-04-04 DIAGNOSIS — R945 Abnormal results of liver function studies: Secondary | ICD-10-CM | POA: Diagnosis not present

## 2021-04-04 DIAGNOSIS — M25552 Pain in left hip: Secondary | ICD-10-CM | POA: Diagnosis not present

## 2021-04-04 DIAGNOSIS — N183 Chronic kidney disease, stage 3 unspecified: Secondary | ICD-10-CM | POA: Diagnosis not present

## 2021-04-18 DIAGNOSIS — E785 Hyperlipidemia, unspecified: Secondary | ICD-10-CM | POA: Diagnosis not present

## 2021-04-18 DIAGNOSIS — C61 Malignant neoplasm of prostate: Secondary | ICD-10-CM | POA: Diagnosis not present

## 2021-04-18 DIAGNOSIS — D84821 Immunodeficiency due to drugs: Secondary | ICD-10-CM | POA: Diagnosis not present

## 2021-04-18 DIAGNOSIS — I495 Sick sinus syndrome: Secondary | ICD-10-CM | POA: Diagnosis not present

## 2021-04-18 DIAGNOSIS — K219 Gastro-esophageal reflux disease without esophagitis: Secondary | ICD-10-CM | POA: Diagnosis not present

## 2021-04-18 DIAGNOSIS — K5909 Other constipation: Secondary | ICD-10-CM | POA: Diagnosis not present

## 2021-04-18 DIAGNOSIS — E7800 Pure hypercholesterolemia, unspecified: Secondary | ICD-10-CM | POA: Diagnosis not present

## 2021-04-18 DIAGNOSIS — R030 Elevated blood-pressure reading, without diagnosis of hypertension: Secondary | ICD-10-CM | POA: Diagnosis not present

## 2021-04-18 DIAGNOSIS — R7989 Other specified abnormal findings of blood chemistry: Secondary | ICD-10-CM | POA: Diagnosis not present

## 2021-04-18 DIAGNOSIS — Z79899 Other long term (current) drug therapy: Secondary | ICD-10-CM | POA: Diagnosis not present

## 2021-04-18 DIAGNOSIS — Z8249 Family history of ischemic heart disease and other diseases of the circulatory system: Secondary | ICD-10-CM | POA: Diagnosis not present

## 2021-04-18 DIAGNOSIS — N183 Chronic kidney disease, stage 3 unspecified: Secondary | ICD-10-CM | POA: Diagnosis not present

## 2021-04-18 DIAGNOSIS — M1990 Unspecified osteoarthritis, unspecified site: Secondary | ICD-10-CM | POA: Diagnosis not present

## 2021-04-18 DIAGNOSIS — E1122 Type 2 diabetes mellitus with diabetic chronic kidney disease: Secondary | ICD-10-CM | POA: Diagnosis not present

## 2021-04-18 DIAGNOSIS — I10 Essential (primary) hypertension: Secondary | ICD-10-CM | POA: Diagnosis not present

## 2021-04-18 DIAGNOSIS — N400 Enlarged prostate without lower urinary tract symptoms: Secondary | ICD-10-CM | POA: Diagnosis not present

## 2021-04-18 DIAGNOSIS — E1121 Type 2 diabetes mellitus with diabetic nephropathy: Secondary | ICD-10-CM | POA: Diagnosis not present

## 2021-04-18 DIAGNOSIS — N529 Male erectile dysfunction, unspecified: Secondary | ICD-10-CM | POA: Diagnosis not present

## 2021-04-18 DIAGNOSIS — R945 Abnormal results of liver function studies: Secondary | ICD-10-CM | POA: Diagnosis not present

## 2021-04-18 DIAGNOSIS — T386X5D Adverse effect of antigonadotrophins, antiestrogens, antiandrogens, not elsewhere classified, subsequent encounter: Secondary | ICD-10-CM | POA: Diagnosis not present

## 2021-05-06 DIAGNOSIS — Z79899 Other long term (current) drug therapy: Secondary | ICD-10-CM | POA: Diagnosis not present

## 2021-05-06 DIAGNOSIS — R339 Retention of urine, unspecified: Secondary | ICD-10-CM | POA: Diagnosis not present

## 2021-05-06 DIAGNOSIS — C61 Malignant neoplasm of prostate: Secondary | ICD-10-CM | POA: Diagnosis not present

## 2021-06-11 DIAGNOSIS — R945 Abnormal results of liver function studies: Secondary | ICD-10-CM | POA: Diagnosis not present

## 2021-06-11 DIAGNOSIS — N183 Chronic kidney disease, stage 3 unspecified: Secondary | ICD-10-CM | POA: Diagnosis not present

## 2021-06-11 DIAGNOSIS — M25562 Pain in left knee: Secondary | ICD-10-CM | POA: Diagnosis not present

## 2021-06-11 DIAGNOSIS — I495 Sick sinus syndrome: Secondary | ICD-10-CM | POA: Diagnosis not present

## 2021-06-11 DIAGNOSIS — E1122 Type 2 diabetes mellitus with diabetic chronic kidney disease: Secondary | ICD-10-CM | POA: Diagnosis not present

## 2021-06-11 DIAGNOSIS — R7989 Other specified abnormal findings of blood chemistry: Secondary | ICD-10-CM | POA: Diagnosis not present

## 2021-06-11 DIAGNOSIS — K219 Gastro-esophageal reflux disease without esophagitis: Secondary | ICD-10-CM | POA: Diagnosis not present

## 2021-06-11 DIAGNOSIS — E669 Obesity, unspecified: Secondary | ICD-10-CM | POA: Diagnosis not present

## 2021-06-11 DIAGNOSIS — N529 Male erectile dysfunction, unspecified: Secondary | ICD-10-CM | POA: Diagnosis not present

## 2021-06-14 DIAGNOSIS — M25562 Pain in left knee: Secondary | ICD-10-CM | POA: Diagnosis not present

## 2021-06-14 DIAGNOSIS — I495 Sick sinus syndrome: Secondary | ICD-10-CM | POA: Diagnosis not present

## 2021-06-14 DIAGNOSIS — N529 Male erectile dysfunction, unspecified: Secondary | ICD-10-CM | POA: Diagnosis not present

## 2021-06-14 DIAGNOSIS — N183 Chronic kidney disease, stage 3 unspecified: Secondary | ICD-10-CM | POA: Diagnosis not present

## 2021-06-14 DIAGNOSIS — E669 Obesity, unspecified: Secondary | ICD-10-CM | POA: Diagnosis not present

## 2021-06-14 DIAGNOSIS — R945 Abnormal results of liver function studies: Secondary | ICD-10-CM | POA: Diagnosis not present

## 2021-06-14 DIAGNOSIS — E1122 Type 2 diabetes mellitus with diabetic chronic kidney disease: Secondary | ICD-10-CM | POA: Diagnosis not present

## 2021-06-14 DIAGNOSIS — K219 Gastro-esophageal reflux disease without esophagitis: Secondary | ICD-10-CM | POA: Diagnosis not present

## 2021-06-14 DIAGNOSIS — R7989 Other specified abnormal findings of blood chemistry: Secondary | ICD-10-CM | POA: Diagnosis not present

## 2021-06-18 DIAGNOSIS — K219 Gastro-esophageal reflux disease without esophagitis: Secondary | ICD-10-CM | POA: Diagnosis not present

## 2021-06-18 DIAGNOSIS — N529 Male erectile dysfunction, unspecified: Secondary | ICD-10-CM | POA: Diagnosis not present

## 2021-06-18 DIAGNOSIS — M25562 Pain in left knee: Secondary | ICD-10-CM | POA: Diagnosis not present

## 2021-06-18 DIAGNOSIS — R7989 Other specified abnormal findings of blood chemistry: Secondary | ICD-10-CM | POA: Diagnosis not present

## 2021-06-18 DIAGNOSIS — K5909 Other constipation: Secondary | ICD-10-CM | POA: Diagnosis not present

## 2021-06-18 DIAGNOSIS — N183 Chronic kidney disease, stage 3 unspecified: Secondary | ICD-10-CM | POA: Diagnosis not present

## 2021-06-18 DIAGNOSIS — E7800 Pure hypercholesterolemia, unspecified: Secondary | ICD-10-CM | POA: Diagnosis not present

## 2021-06-18 DIAGNOSIS — E1122 Type 2 diabetes mellitus with diabetic chronic kidney disease: Secondary | ICD-10-CM | POA: Diagnosis not present

## 2021-06-18 DIAGNOSIS — I495 Sick sinus syndrome: Secondary | ICD-10-CM | POA: Diagnosis not present

## 2021-06-18 DIAGNOSIS — R945 Abnormal results of liver function studies: Secondary | ICD-10-CM | POA: Diagnosis not present

## 2021-06-24 DIAGNOSIS — M25562 Pain in left knee: Secondary | ICD-10-CM | POA: Diagnosis not present

## 2021-06-24 DIAGNOSIS — M1712 Unilateral primary osteoarthritis, left knee: Secondary | ICD-10-CM | POA: Diagnosis not present

## 2021-07-02 DIAGNOSIS — M7122 Synovial cyst of popliteal space [Baker], left knee: Secondary | ICD-10-CM | POA: Diagnosis not present

## 2021-07-02 DIAGNOSIS — M25561 Pain in right knee: Secondary | ICD-10-CM | POA: Diagnosis not present

## 2021-07-02 DIAGNOSIS — M25562 Pain in left knee: Secondary | ICD-10-CM | POA: Diagnosis not present

## 2021-07-02 DIAGNOSIS — M1712 Unilateral primary osteoarthritis, left knee: Secondary | ICD-10-CM | POA: Diagnosis not present

## 2021-07-04 DIAGNOSIS — M1712 Unilateral primary osteoarthritis, left knee: Secondary | ICD-10-CM | POA: Diagnosis not present

## 2021-08-18 DIAGNOSIS — N183 Chronic kidney disease, stage 3 unspecified: Secondary | ICD-10-CM | POA: Diagnosis not present

## 2021-08-18 DIAGNOSIS — R945 Abnormal results of liver function studies: Secondary | ICD-10-CM | POA: Diagnosis not present

## 2021-08-18 DIAGNOSIS — E1121 Type 2 diabetes mellitus with diabetic nephropathy: Secondary | ICD-10-CM | POA: Diagnosis not present

## 2021-08-18 DIAGNOSIS — K219 Gastro-esophageal reflux disease without esophagitis: Secondary | ICD-10-CM | POA: Diagnosis not present

## 2021-08-18 DIAGNOSIS — I495 Sick sinus syndrome: Secondary | ICD-10-CM | POA: Diagnosis not present

## 2021-08-18 DIAGNOSIS — M25562 Pain in left knee: Secondary | ICD-10-CM | POA: Diagnosis not present

## 2021-08-18 DIAGNOSIS — E1122 Type 2 diabetes mellitus with diabetic chronic kidney disease: Secondary | ICD-10-CM | POA: Diagnosis not present

## 2021-08-18 DIAGNOSIS — D692 Other nonthrombocytopenic purpura: Secondary | ICD-10-CM | POA: Diagnosis not present

## 2021-08-18 DIAGNOSIS — I10 Essential (primary) hypertension: Secondary | ICD-10-CM | POA: Diagnosis not present

## 2021-08-18 DIAGNOSIS — Z23 Encounter for immunization: Secondary | ICD-10-CM | POA: Diagnosis not present

## 2021-08-18 DIAGNOSIS — N529 Male erectile dysfunction, unspecified: Secondary | ICD-10-CM | POA: Diagnosis not present

## 2021-10-17 DIAGNOSIS — R7989 Other specified abnormal findings of blood chemistry: Secondary | ICD-10-CM | POA: Diagnosis not present

## 2021-10-17 DIAGNOSIS — I10 Essential (primary) hypertension: Secondary | ICD-10-CM | POA: Diagnosis not present

## 2021-10-17 DIAGNOSIS — M25562 Pain in left knee: Secondary | ICD-10-CM | POA: Diagnosis not present

## 2021-10-17 DIAGNOSIS — N529 Male erectile dysfunction, unspecified: Secondary | ICD-10-CM | POA: Diagnosis not present

## 2021-10-17 DIAGNOSIS — D692 Other nonthrombocytopenic purpura: Secondary | ICD-10-CM | POA: Diagnosis not present

## 2021-10-17 DIAGNOSIS — N183 Chronic kidney disease, stage 3 unspecified: Secondary | ICD-10-CM | POA: Diagnosis not present

## 2021-10-17 DIAGNOSIS — K219 Gastro-esophageal reflux disease without esophagitis: Secondary | ICD-10-CM | POA: Diagnosis not present

## 2021-10-17 DIAGNOSIS — I495 Sick sinus syndrome: Secondary | ICD-10-CM | POA: Diagnosis not present

## 2021-10-17 DIAGNOSIS — E1122 Type 2 diabetes mellitus with diabetic chronic kidney disease: Secondary | ICD-10-CM | POA: Diagnosis not present

## 2021-10-17 DIAGNOSIS — R945 Abnormal results of liver function studies: Secondary | ICD-10-CM | POA: Diagnosis not present

## 2021-12-23 DIAGNOSIS — J208 Acute bronchitis due to other specified organisms: Secondary | ICD-10-CM | POA: Diagnosis not present

## 2021-12-23 DIAGNOSIS — D692 Other nonthrombocytopenic purpura: Secondary | ICD-10-CM | POA: Diagnosis not present

## 2021-12-23 DIAGNOSIS — E7800 Pure hypercholesterolemia, unspecified: Secondary | ICD-10-CM | POA: Diagnosis not present

## 2021-12-23 DIAGNOSIS — K219 Gastro-esophageal reflux disease without esophagitis: Secondary | ICD-10-CM | POA: Diagnosis not present

## 2021-12-23 DIAGNOSIS — E1122 Type 2 diabetes mellitus with diabetic chronic kidney disease: Secondary | ICD-10-CM | POA: Diagnosis not present

## 2021-12-23 DIAGNOSIS — R945 Abnormal results of liver function studies: Secondary | ICD-10-CM | POA: Diagnosis not present

## 2021-12-23 DIAGNOSIS — N183 Chronic kidney disease, stage 3 unspecified: Secondary | ICD-10-CM | POA: Diagnosis not present

## 2021-12-23 DIAGNOSIS — E1121 Type 2 diabetes mellitus with diabetic nephropathy: Secondary | ICD-10-CM | POA: Diagnosis not present

## 2021-12-23 DIAGNOSIS — M25562 Pain in left knee: Secondary | ICD-10-CM | POA: Diagnosis not present

## 2021-12-23 DIAGNOSIS — N529 Male erectile dysfunction, unspecified: Secondary | ICD-10-CM | POA: Diagnosis not present

## 2021-12-23 DIAGNOSIS — B9689 Other specified bacterial agents as the cause of diseases classified elsewhere: Secondary | ICD-10-CM | POA: Diagnosis not present

## 2021-12-25 DIAGNOSIS — C61 Malignant neoplasm of prostate: Secondary | ICD-10-CM | POA: Diagnosis not present

## 2021-12-25 DIAGNOSIS — Z79899 Other long term (current) drug therapy: Secondary | ICD-10-CM | POA: Diagnosis not present

## 2021-12-25 DIAGNOSIS — R339 Retention of urine, unspecified: Secondary | ICD-10-CM | POA: Diagnosis not present

## 2022-02-24 DIAGNOSIS — E785 Hyperlipidemia, unspecified: Secondary | ICD-10-CM | POA: Diagnosis not present

## 2022-02-24 DIAGNOSIS — Z0000 Encounter for general adult medical examination without abnormal findings: Secondary | ICD-10-CM | POA: Diagnosis not present

## 2022-02-24 DIAGNOSIS — Z139 Encounter for screening, unspecified: Secondary | ICD-10-CM | POA: Diagnosis not present

## 2022-02-24 DIAGNOSIS — Z1331 Encounter for screening for depression: Secondary | ICD-10-CM | POA: Diagnosis not present

## 2022-02-24 DIAGNOSIS — Z9181 History of falling: Secondary | ICD-10-CM | POA: Diagnosis not present
